# Patient Record
Sex: Female | Born: 1990 | Race: Black or African American | Hispanic: No | Marital: Single | Smoking: Current every day smoker
Health system: Southern US, Community
[De-identification: ages and names within clinical notes are randomized; demographics above are authoritative.]

## PROBLEM LIST (undated history)

## (undated) DIAGNOSIS — R87610 Atypical squamous cells of undetermined significance on cytologic smear of cervix (ASC-US): Secondary | ICD-10-CM

## (undated) DIAGNOSIS — Z8619 Personal history of other infectious and parasitic diseases: Secondary | ICD-10-CM

## (undated) DIAGNOSIS — N83209 Unspecified ovarian cyst, unspecified side: Secondary | ICD-10-CM

## (undated) DIAGNOSIS — N390 Urinary tract infection, site not specified: Secondary | ICD-10-CM

## (undated) DIAGNOSIS — N760 Acute vaginitis: Secondary | ICD-10-CM

## (undated) DIAGNOSIS — B9689 Other specified bacterial agents as the cause of diseases classified elsewhere: Secondary | ICD-10-CM

## (undated) DIAGNOSIS — R87810 Cervical high risk human papillomavirus (HPV) DNA test positive: Secondary | ICD-10-CM

## (undated) HISTORY — DX: Acute vaginitis: N76.0

## (undated) HISTORY — DX: Cervical high risk human papillomavirus (HPV) DNA test positive: R87.810

## (undated) HISTORY — DX: Other specified bacterial agents as the cause of diseases classified elsewhere: B96.89

## (undated) HISTORY — DX: Unspecified ovarian cyst, unspecified side: N83.209

## (undated) HISTORY — DX: Atypical squamous cells of undetermined significance on cytologic smear of cervix (ASC-US): R87.610

## (undated) HISTORY — DX: Personal history of other infectious and parasitic diseases: Z86.19

---
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
## 2010-05-06 ENCOUNTER — Emergency Department (HOSPITAL_COMMUNITY): Admission: EM | Admit: 2010-05-06 | Discharge: 2010-05-06 | Payer: Self-pay | Admitting: Family Medicine

## 2010-11-03 LAB — POCT URINALYSIS DIP (DEVICE)
Glucose, UA: NEGATIVE mg/dL
Ketones, ur: 15 mg/dL — AB
Protein, ur: 100 mg/dL — AB
Specific Gravity, Urine: 1.025 (ref 1.005–1.030)

## 2010-11-03 LAB — POCT PREGNANCY, URINE: Preg Test, Ur: NEGATIVE

## 2011-04-29 ENCOUNTER — Encounter (HOSPITAL_COMMUNITY): Payer: Self-pay

## 2011-04-29 ENCOUNTER — Inpatient Hospital Stay (HOSPITAL_COMMUNITY)
Admission: AD | Admit: 2011-04-29 | Discharge: 2011-04-29 | Disposition: A | Discharge status: Home / Self Care | Payer: Self-pay | Source: Ambulatory Visit | Attending: Obstetrics & Gynecology | Admitting: Obstetrics & Gynecology

## 2011-04-29 DIAGNOSIS — Z331 Pregnant state, incidental: Secondary | ICD-10-CM

## 2011-04-29 DIAGNOSIS — Z3201 Encounter for pregnancy test, result positive: Secondary | ICD-10-CM | POA: Insufficient documentation

## 2011-04-29 LAB — POCT PREGNANCY, URINE: Preg Test, Ur: POSITIVE

## 2011-04-29 NOTE — ED Provider Notes (Signed)
History     Chief Complaint  Patient presents with  . Possible Pregnancy   HPIBianca R Garner20 y.o. is here for pregnancy verification.  She is G1.  LMP was 03/20/11.  She is upset about the test results.  She had a positive test at home and it is positive here.  She denies pain and vaginal bleeding.      No past medical history on file.  No past surgical history on file.  No family history on file.  History  Substance Use Topics  . Smoking status: Not on file  . Smokeless tobacco: Not on file  . Alcohol Use: Not on file    Allergies: Allergies not on file  No prescriptions prior to admission    Review of Systems  Gastrointestinal: Positive for nausea, vomiting and abdominal pain.  Genitourinary: Negative.    Physical Exam   Blood pressure 120/81, pulse 88, temperature 98.1 F (36.7 C), temperature source Oral, resp. rate 16, height 5\' 1"  (1.549 m), weight 103 lb 6 oz (46.891 kg), last menstrual period 03/20/2011.  Physical Exam  Not indicated  MAU Course  Procedures  MDM   Assessment and Plan  A:  Positive pregnancy test  P  Verification letter given.  KEY,EVE M 04/29/2011, 3:29 PM   Matt Holmes, NP 04/29/11 1532

## 2011-04-29 NOTE — Progress Notes (Signed)
Pt states lmp 03/20/2011, home upt yesterday positive, denies pain, bleeding or vag d/c. Here for pregnancy verification letter.

## 2011-04-29 NOTE — ED Provider Notes (Signed)
Attestation of Attending Supervision of Advanced Practitioner: Evaluation and management procedures were performed by the PA/NP/CNM/OB Fellow under my supervision/collaboration. Chart reviewed and agree with management and plan.  Avion Patella A 04/29/2011 6:44 PM

## 2011-06-09 ENCOUNTER — Emergency Department: Payer: Self-pay | Admitting: Unknown Physician Specialty

## 2011-10-13 ENCOUNTER — Encounter (HOSPITAL_COMMUNITY): Payer: Self-pay | Admitting: Emergency Medicine

## 2011-10-13 ENCOUNTER — Emergency Department (HOSPITAL_COMMUNITY)
Admission: EM | Admit: 2011-10-13 | Discharge: 2011-10-13 | Disposition: A | Discharge status: Home / Self Care | Payer: Self-pay | Attending: Emergency Medicine | Admitting: Emergency Medicine

## 2011-10-13 DIAGNOSIS — F17200 Nicotine dependence, unspecified, uncomplicated: Secondary | ICD-10-CM | POA: Insufficient documentation

## 2011-10-13 DIAGNOSIS — N760 Acute vaginitis: Secondary | ICD-10-CM | POA: Insufficient documentation

## 2011-10-13 DIAGNOSIS — A499 Bacterial infection, unspecified: Secondary | ICD-10-CM | POA: Insufficient documentation

## 2011-10-13 DIAGNOSIS — M2550 Pain in unspecified joint: Secondary | ICD-10-CM | POA: Insufficient documentation

## 2011-10-13 DIAGNOSIS — N949 Unspecified condition associated with female genital organs and menstrual cycle: Secondary | ICD-10-CM | POA: Insufficient documentation

## 2011-10-13 DIAGNOSIS — M25559 Pain in unspecified hip: Secondary | ICD-10-CM

## 2011-10-13 DIAGNOSIS — B9689 Other specified bacterial agents as the cause of diseases classified elsewhere: Secondary | ICD-10-CM

## 2011-10-13 DIAGNOSIS — R109 Unspecified abdominal pain: Secondary | ICD-10-CM | POA: Insufficient documentation

## 2011-10-13 HISTORY — DX: Urinary tract infection, site not specified: N39.0

## 2011-10-13 LAB — WET PREP, GENITAL
Trich, Wet Prep: NONE SEEN
Yeast Wet Prep HPF POC: NONE SEEN

## 2011-10-13 LAB — URINALYSIS, ROUTINE W REFLEX MICROSCOPIC
Nitrite: NEGATIVE
Specific Gravity, Urine: 1.027 (ref 1.005–1.030)
Urobilinogen, UA: 1 mg/dL (ref 0.0–1.0)

## 2011-10-13 LAB — PREGNANCY, URINE: Preg Test, Ur: NEGATIVE

## 2011-10-13 MED ORDER — ACETAMINOPHEN-CODEINE #3 300-30 MG PO TABS: 1.0000 | ORAL_TABLET | Freq: Four times a day (QID) | ORAL | Status: AC | PRN

## 2011-10-13 MED ORDER — METRONIDAZOLE 500 MG PO TABS: 500.0000 mg | ORAL_TABLET | Freq: Two times a day (BID) | ORAL | Status: AC

## 2011-10-13 NOTE — Discharge Instructions (Signed)
Bacterial Vaginosis Bacterial vaginosis (BV) is a vaginal infection where the normal balance of bacteria in the vagina is disrupted. The normal balance is then replaced by an overgrowth of certain bacteria. There are several different kinds of bacteria that can cause BV. BV is the most common vaginal infection in women of childbearing age. CAUSES   The cause of BV is not fully understood. BV develops when there is an increase or imbalance of harmful bacteria.   Some activities or behaviors can upset the normal balance of bacteria in the vagina and put women at increased risk including:   Having a new sex partner or multiple sex partners.   Douching.   Using an intrauterine device (IUD) for contraception.   It is not clear what role sexual activity plays in the development of BV. However, women that have never had sexual intercourse are rarely infected with BV.  Women do not get BV from toilet seats, bedding, swimming pools or from touching objects around them.  SYMPTOMS   Grey vaginal discharge.   A fish-like odor with discharge, especially after sexual intercourse.   Itching or burning of the vagina and vulva.   Burning or pain with urination.   Some women have no signs or symptoms at all.  DIAGNOSIS  Your caregiver must examine the vagina for signs of BV. Your caregiver will perform lab tests and look at the sample of vaginal fluid through a microscope. They will look for bacteria and abnormal cells (clue cells), a pH test higher than 4.5, and a positive amine test all associated with BV.  RISKS AND COMPLICATIONS   Pelvic inflammatory disease (PID).   Infections following gynecology surgery.   Developing HIV.   Developing herpes virus.  TREATMENT  Sometimes BV will clear up without treatment. However, all women with symptoms of BV should be treated to avoid complications, especially if gynecology surgery is planned. Female partners generally do not need to be treated. However,  BV may spread between female sex partners so treatment is helpful in preventing a recurrence of BV.   BV may be treated with antibiotics. The antibiotics come in either pill or vaginal cream forms. Either can be used with nonpregnant or pregnant women, but the recommended dosages differ. These antibiotics are not harmful to the baby.   BV can recur after treatment. If this happens, a second round of antibiotics will often be prescribed.   Treatment is important for pregnant women. If not treated, BV can cause a premature delivery, especially for a pregnant woman who had a premature birth in the past. All pregnant women who have symptoms of BV should be checked and treated.   For chronic reoccurrence of BV, treatment with a type of prescribed gel vaginally twice a week is helpful.  HOME CARE INSTRUCTIONS   Finish all medication as directed by your caregiver.   Do not have sex until treatment is completed.   Tell your sexual partner that you have a vaginal infection. They should see their caregiver and be treated if they have problems, such as a mild rash or itching.   Practice safe sex. Use condoms. Only have 1 sex partner.  PREVENTION  Basic prevention steps can help reduce the risk of upsetting the natural balance of bacteria in the vagina and developing BV:  Do not have sexual intercourse (be abstinent).   Do not douche.   Use all of the medicine prescribed for treatment of BV, even if the signs and symptoms go away.     Tell your sex partner if you have BV. That way, they can be treated, if needed, to prevent reoccurrence.  SEEK MEDICAL CARE IF:   Your symptoms are not improving after 3 days of treatment.   You have increased discharge, pain, or fever.  MAKE SURE YOU:   Understand these instructions.   Will watch your condition.   Will get help right away if you are not doing well or get worse.  FOR MORE INFORMATION  Division of STD Prevention (DSTDP), Centers for Disease  Control and Prevention: SolutionApps.co.za American Social Health Association (ASHA): www.ashastd.org  Document Released: 08/02/2005 Document Revised: 04/14/2011 Document Reviewed: 01/23/2009 [facility] Patient Information 2012 [place], [place].     Hip Pain The hips join the upper legs to the lower pelvis. The bones, cartilage, tendons, and muscles of the hip joint perform a lot of work each day holding your body weight and allowing you to move around. Hip pain is a common symptom. It can range from a minor ache to severe pain on 1 or both hips. Pain may be felt on the inside of the hip joint near the groin, or the outside near the buttocks and upper thigh. There may be swelling or stiffness as well. It occurs more often when a person walks or performs activity. There are many reasons hip pain can develop. CAUSES  It is important to work with your caregiver to identify the cause since many conditions can impact the bones, cartilage, muscles, and tendons of the hips. Causes for hip pain include:  Broken (fractured) bones.   Separation of the thighbone from the hip socket (dislocation).   Torn cartilage of the hip joint.   Swelling (inflammation) of a tendon (tendonitis), the sac within the hip joint (bursitis), or a joint.   A weakening in the abdominal wall (hernia), affecting the nerves to the hip.   Arthritis in the hip joint or lining of the hip joint.   Pinched nerves in the back, hip, or upper thigh.   A bulging disc in the spine (herniated disc).   Rarely, bone infection or cancer.  DIAGNOSIS  The location of your hip pain will help your caregiver understand what may be causing the pain. A diagnosis is based on your medical history, your symptoms, results from your physical exam, and results from diagnostic tests. Diagnostic tests may include X-ray exams, a computerized magnetic scan (magnetic resonance imaging, MRI), or bone scan. TREATMENT  Treatment will depend on the cause of  your hip pain. Treatment may include:  Limiting activities and resting until symptoms improve.   Crutches or other walking supports (a cane or brace).   Ice, elevation, and compression.   Physical therapy or home exercises.   Shoe inserts or special shoes.   Losing weight.   Medications to reduce pain.   Undergoing surgery.  HOME CARE INSTRUCTIONS   Only take over-the-counter or prescription medicines for pain, discomfort, or fever as directed by your caregiver.   Put ice on the injured area:   Put ice in a plastic bag.   Place a towel between your skin and the bag.   Leave the ice on for 15 to 20 minutes at a time, 3 to 4 times a day.   Keep your leg raised (elevated) when possible to lessen swelling.   Avoid activities that cause pain.   Follow specific exercises as directed by your caregiver.   Sleep with a pillow between your legs on your most comfortable side.  Record how often you have hip pain, the location of the pain, and what it feels like. This information may be helpful to you and your caregiver.   Ask your caregiver about returning to work or sports and whether you should drive.   Follow up with your caregiver for further exams, therapy, or testing as directed.  SEEK MEDICAL CARE IF:   Your pain or swelling continues or worsens after 1 week.   You are feeling unwell or have chills.   You have increasing difficulty with walking.   You have a loss of sensation or other new symptoms.   You have questions or concerns.  SEEK IMMEDIATE MEDICAL CARE IF:   You cannot put weight on the affected hip.   You have fallen.   You have a sudden increase in pain and swelling in your hip.   You have a fever.  MAKE SURE YOU:   Understand these instructions.   Will watch your condition.   Will get help right away if you are not doing well or get worse.  Document Released: 01/20/2010 Document Revised: 04/14/2011 Document Reviewed: 01/20/2010 [facility]  Patient Information 2012 [place], [place].      Narcotic and benzodiazepine use may cause drowsiness, slowed breathing or dependence.  Please use with caution and do not drive, operate machinery or watch young children alone while taking them.  Taking combinations of these medications or drinking alcohol will potentiate these effects.   Do not drink alcohol while taking metronidazole antibiotic.  In addition, when taking certain antibiotics, birth control pills may not be as effective.

## 2011-10-13 NOTE — ED Notes (Signed)
Pt c/o right hip/flank pain x 2 months. Pt states pain became slightly worse today, pain worse with movement and palpation.  Pt also c/o pelvic pain and nausea.

## 2011-10-13 NOTE — ED Provider Notes (Signed)
History     CSN: [id number]  Arrival date & time 10/13/11  1246   First MD Initiated Contact with Patient 10/13/11 1512      Chief Complaint  Patient presents with  . Flank Pain  . Pelvic Pain    (Consider location/radiation/quality/duration/timing/severity/associated sxs/prior treatment) HPI Comments: Pt reports 2 separate issues I think.  Pt reports she has intermittent sharp pains in right posterior hip region for past 2-3 months.  Hurts sharply and briefly with certain movements of hip such as stepping up, bending forward.  Pain will then subside.  This AM, she had pain in similar location but around to right lower groin area and had some crampiness associated with it as well.  No dysuria, no abd pain, no N/V/D, constipation.  No change with eating.  No bloating.  LMP was about 16 days ago and was normal.  + sexually active, uses condoms.  Spoke to PCP recently about OCP's but is not using them.  She was fearful today about blood clots, and also inquires about scoliosis.  She denies lower extremity swelling, calf tenderness, has not been flying or prolonged immobilization.  No CP, pleurisy, cough,. SOB.  She denies any unusual vaginal discharge.  She denies fever or chills.    Patient is a 21 y.o. female presenting with flank pain and pelvic pain. The history is provided by the patient.  Flank Pain Pertinent negatives include no chest pain, no abdominal pain and no shortness of breath.  Pelvic Pain Pertinent negatives include no chest pain, no abdominal pain and no shortness of breath.    Past Medical History  Diagnosis Date  . Urinary tract infection     History reviewed. No pertinent past surgical history.  History reviewed. No pertinent family history.  History  Substance Use Topics  . Smoking status: Current Everyday Smoker  . Smokeless tobacco: Not on file  . Alcohol Use: Yes     weekends    OB History    Grav Para Term Preterm Abortions TAB SAB Ect Mult Living   1               Review of Systems  Constitutional: Negative.   Respiratory: Negative for shortness of breath.   Cardiovascular: Negative for chest pain.  Gastrointestinal: Negative for nausea, vomiting, abdominal pain, diarrhea, constipation and blood in stool.  Genitourinary: Positive for flank pain and pelvic pain. Negative for dysuria, vaginal bleeding, vaginal discharge and vaginal pain.  Musculoskeletal: Positive for arthralgias. Negative for back pain.  Skin: Negative for rash.    Allergies  Review of patient's allergies indicates no known allergies.  Home Medications   Current Outpatient Rx  Name Route Sig Dispense Refill  . ACETAMINOPHEN 325 MG PO TABS Oral Take 650 mg by mouth every 6 (six) hours as needed. For pain    . IBUPROFEN 200 MG PO TABS Oral Take 200 mg by mouth every 6 (six) hours as needed. For pain    . ACETAMINOPHEN-CODEINE #3 300-30 MG PO TABS Oral Take 1-2 tablets by mouth every 6 (six) hours as needed for pain. 20 tablet 0  . METRONIDAZOLE 500 MG PO TABS Oral Take 1 tablet (500 mg total) by mouth 2 (two) times daily. 14 tablet 0    BP 110/62  Pulse 81  Temp(Src) 98.5 F (36.9 C) (Oral)  Resp 16  SpO2 99%  LMP 09/29/2011  Breastfeeding? Unknown  Physical Exam  Nursing note and vitals reviewed. Constitutional: She appears well-developed and well-nourished.  Cardiovascular: Normal rate and regular rhythm.   Pulmonary/Chest: Effort normal.  Abdominal: Soft. Bowel sounds are normal. She exhibits no distension and no mass. There is no tenderness. There is no rebound and no guarding. Hernia confirmed negative in the right inguinal area.  Genitourinary: Vagina normal and uterus normal. Pelvic exam was performed with patient supine. There is no tenderness or lesion on the right labia. There is no tenderness or lesion on the left labia. Cervix exhibits no motion tenderness, no discharge and no friability. Right adnexum displays tenderness. Right adnexum  displays no mass and no fullness. Left adnexum displays no mass, no tenderness and no fullness.       Creamy white/yellow discharge as well as some mild curd like discharge at posterior fornix.  Pt did being using monistat yesterday  Skin: Skin is warm and dry. No erythema.    ED Course  Procedures (including critical care time)  Labs Reviewed  URINALYSIS, ROUTINE W REFLEX MICROSCOPIC - Abnormal; Notable for the following:    APPearance CLOUDY (*)    Ketones, ur 15 (*)    All other components within normal limits  WET PREP, GENITAL - Abnormal; Notable for the following:    Clue Cells Wet Prep HPF POC FEW (*)    WBC, Wet Prep HPF POC FEW (*)    All other components within normal limits  PREGNANCY, URINE  GC/CHLAMYDIA PROBE AMP, GENITAL   No results found.   1. Hip pain   2. BV (bacterial vaginosis)       MDM  UA and U preg were neg.  Pt's exam suggest slight minimal adnexal tenderness.  Given 2 weeks after her menstural cycle, pt may have ovulated or has a small right ovarian cyst.  I am not clincally concerned for torsion.  Cultures and wet prep sent and she knows she will be called if cultures are positive.      5:18 PM Wet prep shows few clue cells suggesting mild BV.  Otherwise I feel pt has mild adnexal discomfort and probably some early right hip arthritic type pains or arthralgias that I recommended NSAIDs for.      Gavin Pound. Jordi Lacko, MD 10/13/11 1719

## 2011-10-13 NOTE — ED Notes (Signed)
MD at bedside. 

## 2011-10-14 LAB — GC/CHLAMYDIA PROBE AMP, GENITAL
Chlamydia, DNA Probe: NEGATIVE
GC Probe Amp, Genital: NEGATIVE

## 2012-09-08 ENCOUNTER — Emergency Department: Payer: Self-pay | Admitting: Emergency Medicine

## 2012-09-08 LAB — CBC
HCT: 33.4 % — ABNORMAL LOW (ref 35.0–47.0)
HGB: 11.3 g/dL — ABNORMAL LOW (ref 12.0–16.0)
MCH: 31 pg (ref 26.0–34.0)
MCV: 92 fL (ref 80–100)
RBC: 3.63 10*6/uL — ABNORMAL LOW (ref 3.80–5.20)

## 2012-09-08 LAB — URINALYSIS, COMPLETE
Blood: NEGATIVE
Protein: NEGATIVE
RBC,UR: 2 /HPF (ref 0–5)

## 2012-09-08 LAB — BASIC METABOLIC PANEL
Anion Gap: 8 (ref 7–16)
BUN: 8 mg/dL (ref 7–18)
Calcium, Total: 8.7 mg/dL (ref 8.5–10.1)
Creatinine: 0.49 mg/dL — ABNORMAL LOW (ref 0.60–1.30)
EGFR (African American): >60
Glucose: 90 mg/dL (ref 65–99)
Potassium: 3.4 mmol/L — ABNORMAL LOW (ref 3.5–5.1)

## 2012-09-08 LAB — HCG, QUANTITATIVE, PREGNANCY: Beta Hcg, Quant.: 7310 m[IU]/mL — ABNORMAL HIGH

## 2012-09-11 LAB — URINE CULTURE

## 2012-10-05 DIAGNOSIS — R87610 Atypical squamous cells of undetermined significance on cytologic smear of cervix (ASC-US): Secondary | ICD-10-CM

## 2012-10-05 DIAGNOSIS — Z8619 Personal history of other infectious and parasitic diseases: Secondary | ICD-10-CM

## 2012-10-05 HISTORY — DX: Atypical squamous cells of undetermined significance on cytologic smear of cervix (ASC-US): R87.610

## 2012-10-05 HISTORY — DX: Personal history of other infectious and parasitic diseases: Z86.19

## 2012-10-05 LAB — HM PAP SMEAR

## 2013-04-09 ENCOUNTER — Observation Stay: Payer: Self-pay | Admitting: Obstetrics and Gynecology

## 2013-05-04 ENCOUNTER — Inpatient Hospital Stay: Payer: Self-pay | Admitting: Obstetrics and Gynecology

## 2013-05-04 LAB — CBC WITH DIFFERENTIAL/PLATELET
Basophil %: 0.3 %
Eosinophil #: 0 10*3/uL (ref 0.0–0.7)
Eosinophil %: 0.3 %
HCT: 38.5 % (ref 35.0–47.0)
HGB: 13.5 g/dL (ref 12.0–16.0)
Lymphocyte #: 1.3 10*3/uL (ref 1.0–3.6)
Lymphocyte %: 13.9 %
MCV: 92 fL (ref 80–100)
Monocyte %: 8.4 %
Neutrophil %: 77.1 %

## 2014-03-14 IMAGING — US US OB < 14 WEEKS - US OB TV
1 series · 13 of 28 positions shown · non-contrast
Comparison: none

REASON FOR EXAM: pelvic pain
COMMENTS:   May transport without cardiac monitor

[Series 1: us ob < 14 weeks - us ob tv · 0.23mm/px · 13 of 70 slices shown]
[im 3/70]
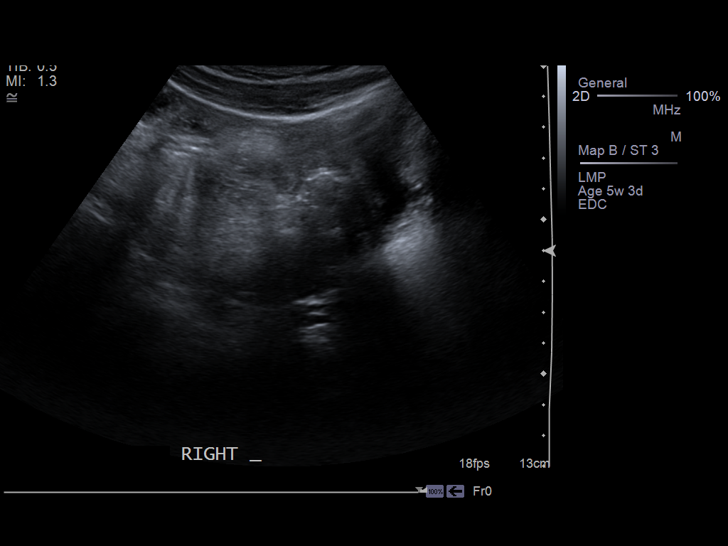
[im 8/70]
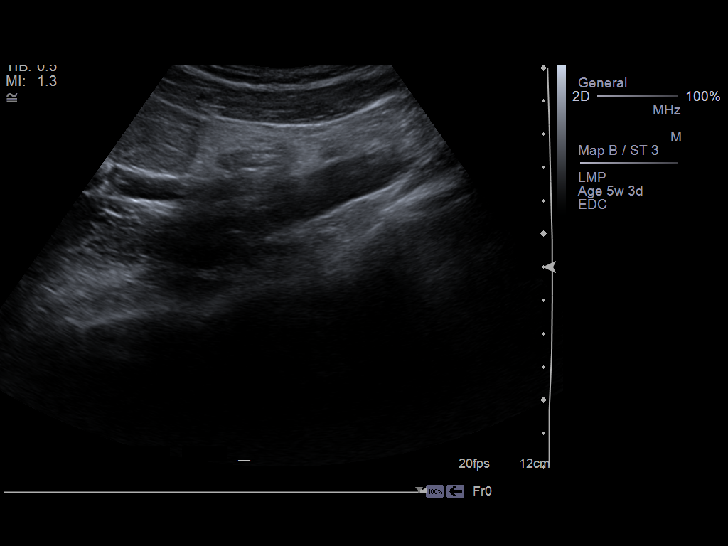
[im 13/70]
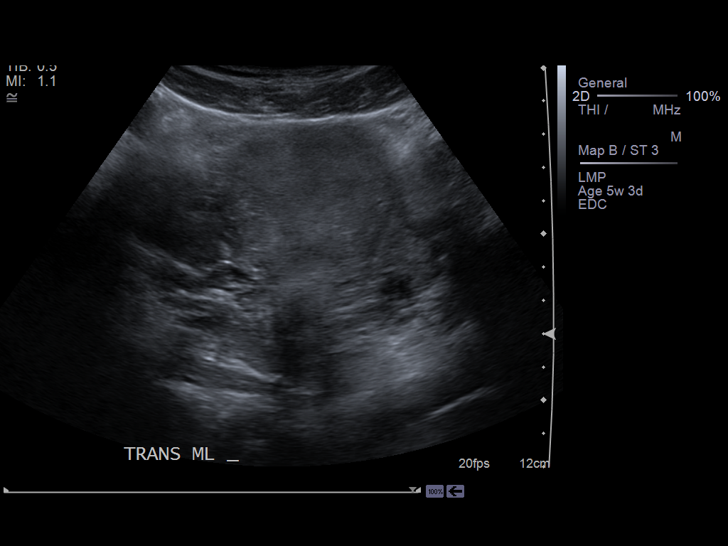
[im 18/70]
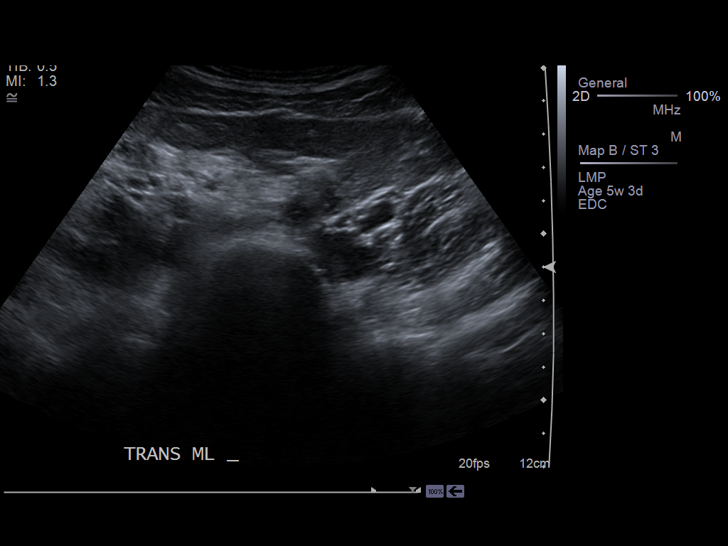
[im 24/70]
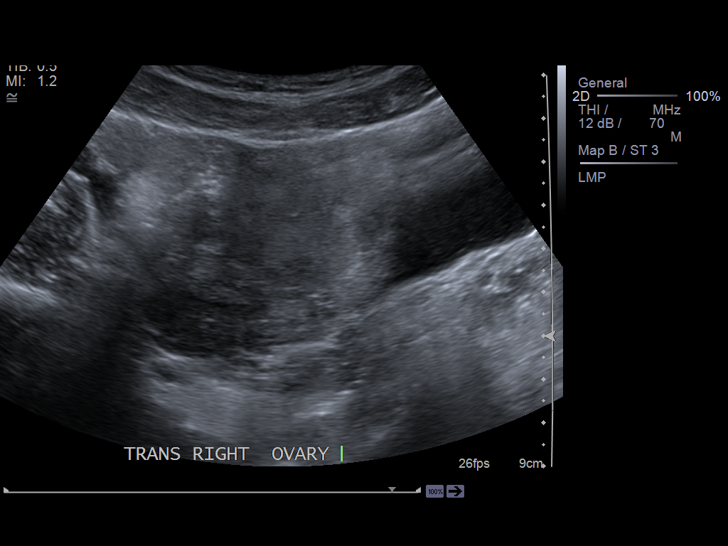
[im 29/70]
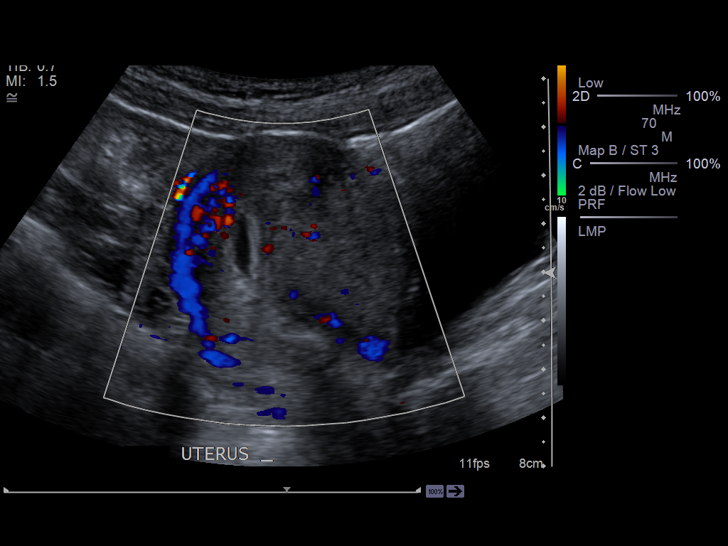
[im 36/70]
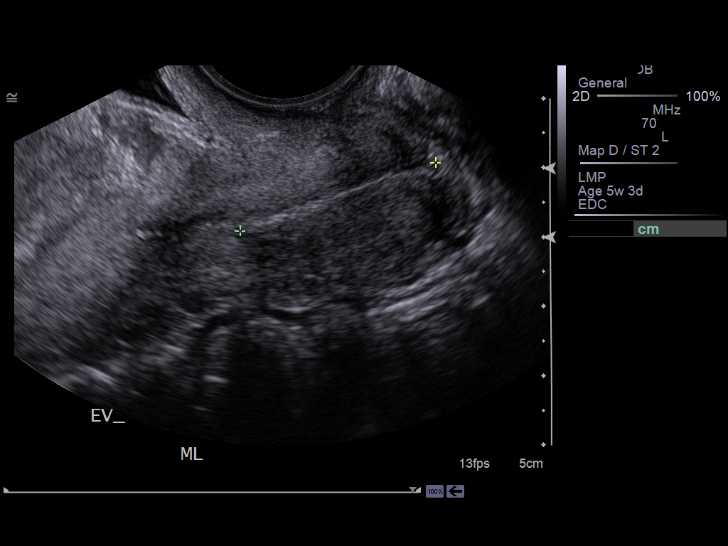
[im 41/70]
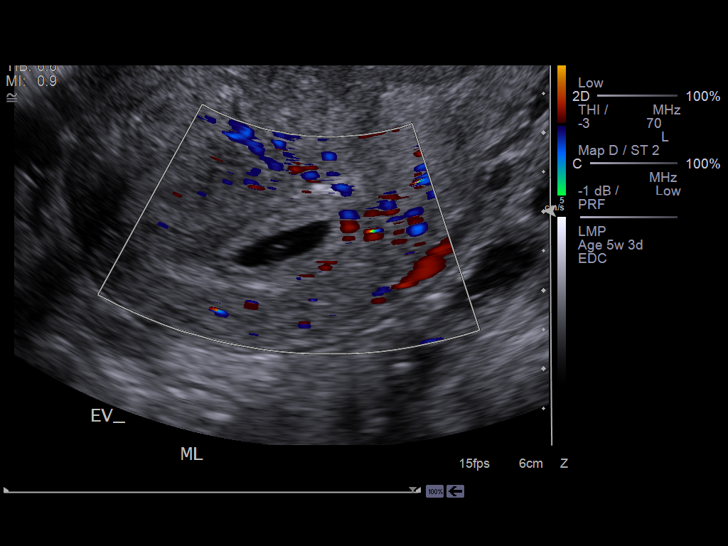
[im 47/70]
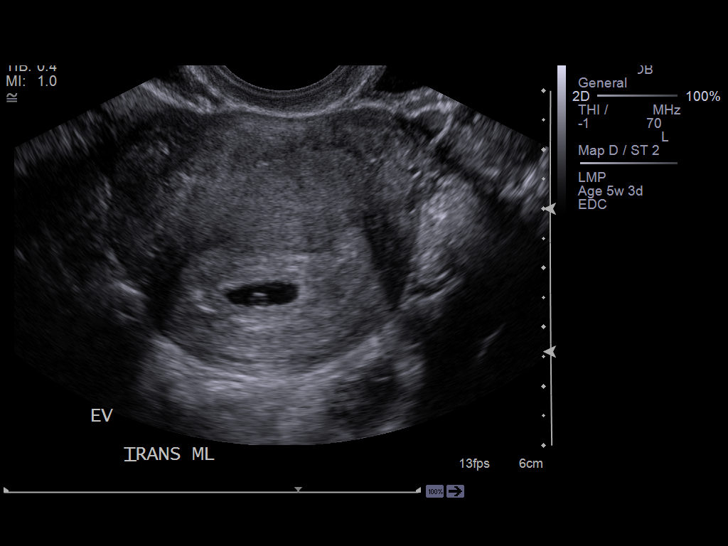
[im 52/70]
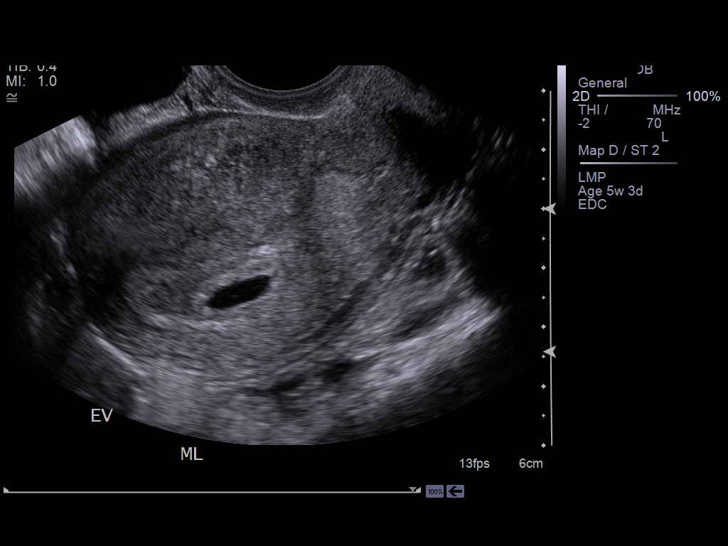
[im 57/70]
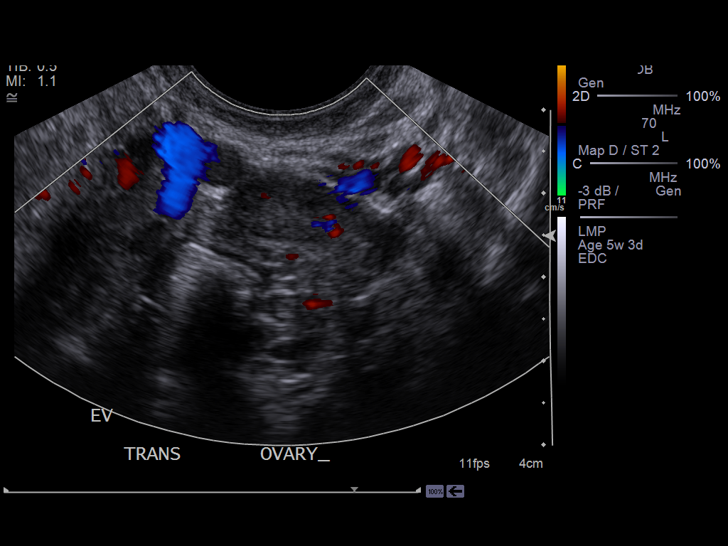
[im 62/70]
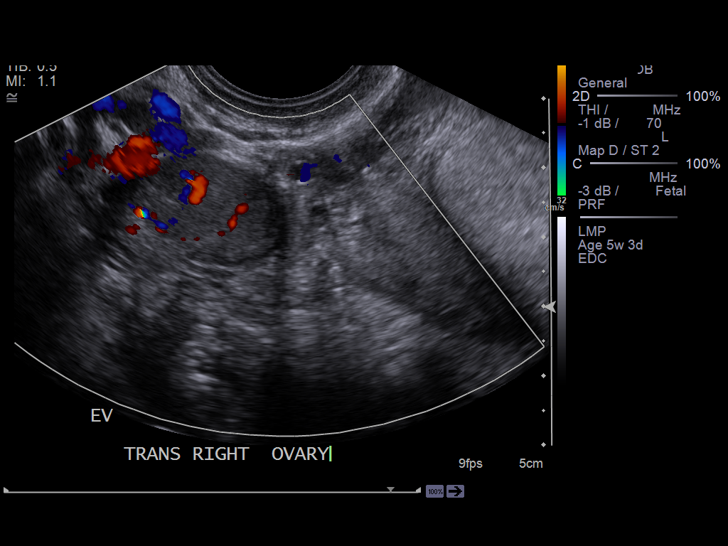
[im 67/70]
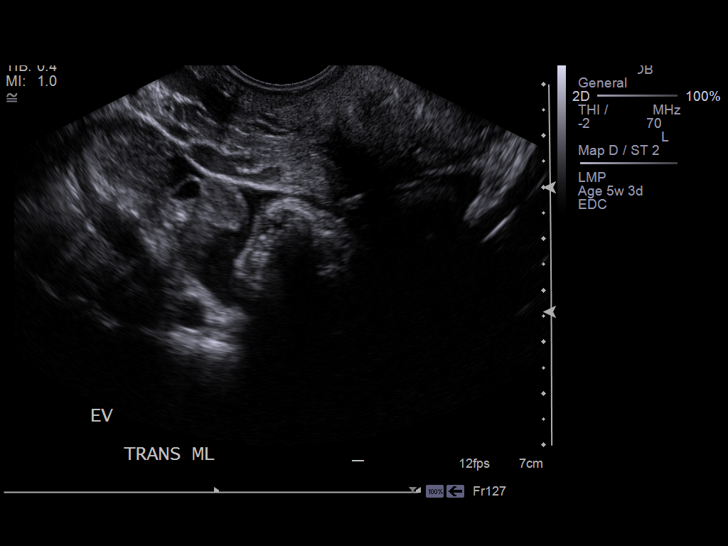

[13 of 28 positions shown; findings below may reference images not displayed]

PROCEDURE:     US  - US OB LESS THAN 14 WEEKS/W TRANS  - September 08, 2012  [DATE]

RESULT:     Emergent pelvic sonogram is performed with transabdominal and
endovaginal imaging. The kidneys show no obstructive change. The right ovary
measures 2.62 x 1.54 x 3.07 cm. The left ovary measures 3.95 x 1.88 x
cm. There is an intrauterine gestation with a yolk sac of 0.32 cm and a
gestational sac diameter of up to 1.32 cm with a mean measurement of 0.96 cm
which is consistent with a 5 week 5 day gestation + / - 12 days. A fetal pole
was not evident. Ultrasound EDC is 06 May, 2013. The uterus measures
8.56 x 5.38 x 5.01 cm. There is a prominent cyst in the left ovary measuring
1.95 x 2.66 x 2.03 cm. Blood flow is documented to both ovaries. There is a
small amount of free fluid seen. This is in the retrouterine cul-de-sac
midline region.
IMPRESSION: 1. Findings suggestive of an early intrauterine gestation, however, a fetal
pole was not identified. Correlate with serial quantitative beta-hCG
examinations and followup ultrasound as necessary. There is a moderately
large left ovarian cyst. There is some free fluid present.

[REDACTED]

## 2014-06-17 ENCOUNTER — Encounter (HOSPITAL_COMMUNITY): Payer: Self-pay | Admitting: Emergency Medicine

## 2014-12-24 NOTE — H&P (Signed)
L&D Evaluation:  History:  HPI 24 yo G3P0020 with EDC of 05/09/13 presenting at 35w gestation with complaint of persistant headache, RUQ pain, swelling, and numbness in her fingers.  She has been normotensive throughout this pregnancy, actually had a 24-hr urine baseline collected by one of the nurse practitioners which was normal (concentrated urine with 2+ protien and 2+ glucose).   Presents with headache   Patient's Medical History No Chronic Illness   Patient's Surgical History none   Medications Pre Natal Vitamins   Allergies NKDA   Social History none   Family History Non-Contributory   ROS:  ROS All systems were reviewed.  HEENT, CNS, GI, GU, Respiratory, CV, Renal and Musculoskeletal systems were found to be normal.   Exam:  Vital Signs stable   Urine Protein negative dipstick   General no apparent distress   Mental Status clear   Abdomen gravid, non-tender   Estimated Fetal Weight Average for gestational age   Back no CVAT   Edema no edema   FHT normal rate with no decels   Ucx absent   Impression:  Impression Headache   Plan:  Comments Reassured no evidence of PreEclampsia although some of the symptoms she reports are seen in preeclampsia she lacks the two hallmarks of elevated BP and proteinuria.  Patient reassured rx compazine for headahce   Electronic Signatures: Lorrene ReidStaebler, Apryll Hinkle M (MD)  (Signed 30-Aug-14 13:36)  Authored: L&D Evaluation   Last Updated: 30-Aug-14 13:36 by Lorrene ReidStaebler, Tivis Wherry M (MD)

## 2014-12-24 NOTE — H&P (Signed)
L&D Evaluation:  History Expanded:  HPI 24 yo G3P0020 with EDC of 05/09/13 presenting at 2339 2/7w gestation with complaint of contractions since MN, no vaginal bleeding or LOF. Good FM.   [facility].  No complications this pregnancy.  +HSV, on Valtrex.  No lesions. GBBS+.   Gravida 3   Term 0   PreTerm 0   Abortion 2   Living 0   EDC 09-May-2013   Presents with contractions   Patient's Medical History No Chronic Illness   Patient's Surgical History none   Medications Pre Natal Vitamins   Allergies NKDA   Social History none   Family History Non-Contributory   ROS:  ROS All systems were reviewed.  HEENT, CNS, GI, GU, Respiratory, CV, Renal and Musculoskeletal systems were found to be normal.   Exam:  Vital Signs stable   Urine Protein negative dipstick   General no apparent distress   Mental Status clear   Chest clear   Heart normal sinus rhythm   Abdomen gravid, tender with contractions   Estimated Fetal Weight Average for gestational age   Back no CVAT   Edema no edema   Pelvic no external lesions, 4/100/BBOW   Mebranes Intact   FHT normal rate with no decels   Ucx regular   Skin dry   Impression:  Impression active labor   Plan:  Plan EFM/NST, monitor contractions and for cervical change, antibiotics for GBBS prophylaxis   Comments May want epidural Consider AROM after ABX   Electronic Signatures: Samantha Duffy, Samantha Weismann Paul (MD)  (Signed 19-Sep-14 10:57)  Authored: L&D Evaluation   Last Updated: 19-Sep-14 10:57 by Samantha Duffy, Samantha Hinch Paul (MD)

## 2015-11-17 ENCOUNTER — Encounter: Payer: Self-pay | Admitting: *Deleted

## 2015-11-17 ENCOUNTER — Ambulatory Visit
Admission: EM | Admit: 2015-11-17 | Discharge: 2015-11-17 | Disposition: A | Discharge status: Home / Self Care | Payer: Managed Care, Other (non HMO) | Attending: Family Medicine | Admitting: Family Medicine

## 2015-11-17 DIAGNOSIS — F32A Depression, unspecified: Secondary | ICD-10-CM

## 2015-11-17 DIAGNOSIS — F329 Major depressive disorder, single episode, unspecified: Secondary | ICD-10-CM

## 2015-11-17 DIAGNOSIS — R5383 Other fatigue: Secondary | ICD-10-CM | POA: Diagnosis not present

## 2015-11-17 DIAGNOSIS — N921 Excessive and frequent menstruation with irregular cycle: Secondary | ICD-10-CM

## 2015-11-17 LAB — URINALYSIS COMPLETE WITH MICROSCOPIC (ARMC ONLY)
Bilirubin Urine: NEGATIVE
Glucose, UA: NEGATIVE mg/dL
Ketones, ur: NEGATIVE mg/dL
Nitrite: NEGATIVE
Specific Gravity, Urine: 1.025 (ref 1.005–1.030)
pH: 6.5 (ref 5.0–8.0)

## 2015-11-17 LAB — CBC WITH DIFFERENTIAL/PLATELET
Basophils Absolute: 0 10*3/uL (ref 0–0.1)
Basophils Relative: 0 %
Eosinophils Absolute: 0.3 10*3/uL (ref 0–0.7)
Eosinophils Relative: 4 %
HCT: 38.3 % (ref 35.0–47.0)
Hemoglobin: 13 g/dL (ref 12.0–16.0)
Lymphocytes Relative: 37 %
Lymphs Abs: 2.9 10*3/uL (ref 1.0–3.6)
MCH: 30.6 pg (ref 26.0–34.0)
MCHC: 33.8 g/dL (ref 32.0–36.0)
MCV: 90.5 fL (ref 80.0–100.0)
Monocytes Absolute: 0.7 10*3/uL (ref 0.2–0.9)
Monocytes Relative: 9 %
Neutro Abs: 3.9 10*3/uL (ref 1.4–6.5)
Neutrophils Relative %: 50 %
Platelets: 324 10*3/uL (ref 150–440)
RBC: 4.24 MIL/uL (ref 3.80–5.20)
RDW: 14.6 % — ABNORMAL HIGH (ref 11.5–14.5)
WBC: 7.8 10*3/uL (ref 3.6–11.0)

## 2015-11-17 LAB — COMPREHENSIVE METABOLIC PANEL
ALT: 10 U/L — ABNORMAL LOW (ref 14–54)
AST: 17 U/L (ref 15–41)
Albumin: 4.2 g/dL (ref 3.5–5.0)
Alkaline Phosphatase: 61 U/L (ref 38–126)
Anion gap: 4 — ABNORMAL LOW (ref 5–15)
BUN: 14 mg/dL (ref 6–20)
CO2: 26 mmol/L (ref 22–32)
Calcium: 9.4 mg/dL (ref 8.9–10.3)
Chloride: 108 mmol/L (ref 101–111)
Creatinine, Ser: 0.74 mg/dL (ref 0.44–1.00)
GFR calc Af Amer: >60 mL/min (ref >60)
GFR calc non Af Amer: >60 mL/min (ref >60)
Glucose, Bld: 84 mg/dL (ref 65–99)
Potassium: 3.9 mmol/L (ref 3.5–5.1)
Sodium: 138 mmol/L (ref 135–145)
Total Bilirubin: 0.4 mg/dL (ref 0.3–1.2)
Total Protein: 8.7 g/dL — ABNORMAL HIGH (ref 6.5–8.1)

## 2015-11-17 LAB — PREGNANCY, URINE: Preg Test, Ur: NEGATIVE

## 2015-11-17 NOTE — ED Provider Notes (Signed)
CSN: [id number]     Arrival date & time 11/17/15  1408 History   First MD Initiated Contact with Patient 11/17/15 1537     Chief Complaint  Patient presents with  . Fatigue  . Weakness  . Pelvic Pain   (Consider location/radiation/quality/duration/timing/severity/associated sxs/prior Treatment) HPI  So 25 year old female who presents with vague symptoms of weakness fatigue and pelvic pain with a history of low iron stores diagnosed at [facility]. States that she continues to not feel well is complaining of backaches and being tired all the time. In addition she has exertional dyspnea. States that she's been having periods despite the use of Provera injections. She was placed  on birth control pills  to try and regulate her periods. Despite this she has been having cramping and still bleeding  They put her on iron pills 3 times a day but she is only able to take it twice a day. She states that they have referred her to GYN. She has not been seen and is waiting for the phone call. This Morning she became alarmed because she was noticing more fatigue and shortness of breath with taking her shower.       Past Medical History  Diagnosis Date  . Urinary tract infection    History reviewed. No pertinent past surgical history. Family History  Problem Relation Age of Onset  . Hypertension Mother    Social History  Substance Use Topics  . Smoking status: Current Every Day Smoker  . Smokeless tobacco: None  . Alcohol Use: Yes     Comment: weekends   OB History    Gravida Para Term Preterm AB TAB SAB Ectopic Multiple Living   1              Review of Systems  Constitutional: Positive for activity change and fatigue. Negative for fever and chills.  Respiratory: Positive for shortness of breath.   Neurological: Positive for weakness.  All other systems reviewed and are negative.   Allergies  Review of patient's allergies indicates no known allergies.  Home  Medications   Prior to Admission medications   Medication Sig Start Date End Date Taking? Authorizing Provider  acetaminophen (TYLENOL) 325 MG tablet Take 650 mg by mouth every 6 (six) hours as needed. For pain   Yes Historical Provider, MD  ibuprofen (ADVIL,MOTRIN) 200 MG tablet Take 200 mg by mouth every 6 (six) hours as needed. For pain   Yes Historical Provider, MD   Meds Ordered and Administered this Visit  Medications - No data to display  BP 114/77 mmHg  Pulse 82  Temp(Src) 98.1 F (36.7 C) (Oral)  Resp 16  Ht 5\' 1"  (1.549 m)  Wt 110 lb (49.896 kg)  BMI 20.80 kg/m2  SpO2 100%  LMP 11/17/2015 No data found.   Physical Exam  Constitutional: She is oriented to person, place, and time. She appears well-developed and well-nourished. No distress.  HENT:  Head: Normocephalic and atraumatic.  Right Ear: External ear normal.  Left Ear: External ear normal.  Nose: Nose normal.  Mouth/Throat: Oropharynx is clear and moist.  Eyes: Conjunctivae are normal. Pupils are equal, round, and reactive to light.  Neck: Normal range of motion. Neck supple.  Pulmonary/Chest: Effort normal and breath sounds normal. No respiratory distress. She has no wheezes. She has no rales.  Abdominal: Soft. Bowel sounds are normal. She exhibits no distension. There is no tenderness. There is no rebound and no guarding.  Musculoskeletal: Normal  range of motion. She exhibits no edema or tenderness.  Neurological: She is alert and oriented to person, place, and time.  Skin: Skin is warm and dry. She is not diaphoretic.  Psychiatric: She has a normal mood and affect. Her behavior is normal. Judgment and thought content normal.  Nursing note and vitals reviewed.   ED Course  Procedures (including critical care time)  Labs Review Labs Reviewed  CBC WITH DIFFERENTIAL/PLATELET - Abnormal; Notable for the following:    RDW 14.6 (*)    All other components within normal limits  COMPREHENSIVE METABOLIC PANEL  - Abnormal; Notable for the following:    Total Protein 8.7 (*)    ALT 10 (*)    Anion gap 4 (*)    All other components within normal limits  URINALYSIS COMPLETEWITH MICROSCOPIC ([facility] ONLY) - Abnormal; Notable for the following:    APPearance HAZY (*)    Hgb urine dipstick 3+ (*)    Protein, ur TRACE (*)    Leukocytes, UA TRACE (*)    Bacteria, UA FEW (*)    Squamous Epithelial / LPF 0-5 (*)    All other components within normal limits  URINE CULTURE  PREGNANCY, URINE  IRON AND TIBC  FERRITIN    Imaging Review No results found.   Visual Acuity Review  Right Eye Distance:   Left Eye Distance:   Bilateral Distance:    Right Eye Near:   Left Eye Near:    Bilateral Near:         MDM   1. Fatigue due to depression   2. Menorrhagia with irregular cycle    Discharge Medication List as of 11/17/2015  5:27 PM    Plan: 1. Test/x-ray results and diagnosis reviewed with patient 2. rx as per orders; risks, benefits, potential side effects reviewed with patient 3. Recommend supportive treatment with Continue taking her iron pills and asked her to add vitamin C at the time she takes it for better uptake of the iron. I asked her if she was depressed and she stated she probably was but did not wish to speak to me at this time. Recommended that she find help through [facility] for referral. She did make that she is not suicidal or homicidal. We will have results of the iron studies tomorrow she'll call for those results. Also asked her to contact [facility] to see if they can expedite her GYN appointment. 4. F/u prn if symptoms worsen or don't improve     Lutricia Feil, PA-C 11/17/15 1735

## 2015-11-17 NOTE — ED Notes (Signed)
C/o general weakness, fatigue, and "pelvic" pain. Hx of low iron.

## 2015-11-17 NOTE — Discharge Instructions (Signed)
Abnormal Uterine Bleeding °Abnormal uterine bleeding can affect women at various stages in life, including teenagers, women in their reproductive years, pregnant women, and women who have reached menopause. Several kinds of uterine bleeding are considered abnormal, including: °· Bleeding or spotting between periods.   °· Bleeding after sexual intercourse.   °· Bleeding that is heavier or more than normal.   °· Periods that last longer than usual. °· Bleeding after menopause.   °Many cases of abnormal uterine bleeding are minor and simple to treat, while others are more serious. Any type of abnormal bleeding should be evaluated by your health care provider. Treatment will depend on the cause of the bleeding. °HOME CARE INSTRUCTIONS °Monitor your condition for any changes. The following actions may help to alleviate any discomfort you are experiencing: °· Avoid the use of tampons and douches as directed by your health care provider. °· Change your pads frequently. °You should get regular pelvic exams and Pap tests. Keep all follow-up appointments for diagnostic tests as directed by your health care provider.  °SEEK MEDICAL CARE IF:  °· Your bleeding lasts more than 1 week.   °· You feel dizzy at times.   °SEEK IMMEDIATE MEDICAL CARE IF:  °· You pass out.   °· You are changing pads every 15 to 30 minutes.   °· You have abdominal pain. °· You have a fever.   °· You become sweaty or weak.   °· You are passing large blood clots from the vagina.   °· You start to feel nauseous and vomit. °MAKE SURE YOU:  °· Understand these instructions. °· Will watch your condition. °· Will get help right away if you are not doing well or get worse. °  °This information is not intended to replace advice given to you by your health care provider. Make sure you discuss any questions you have with your health care provider. °  °Document Released: 08/02/2005 Document Revised: 08/07/2013 Document Reviewed: 03/01/2013 °Elsevier Interactive  Patient Education ©2016 [employer] ° °Menorrhagia °Menorrhagia is the medical term for when your menstrual periods are heavy or last longer than usual. With menorrhagia, every period you have may cause enough blood loss and cramping that you are unable to maintain your usual activities. °CAUSES  °In some cases, the cause of heavy periods is unknown, but a number of conditions may cause menorrhagia. Common causes include: °· A problem with the hormone-producing thyroid gland (hypothyroid). °· Noncancerous growths in the uterus (polyps or fibroids). °· An imbalance of the estrogen and progesterone hormones. °· One of your ovaries not releasing an egg during one or more months. °· Side effects of having an intrauterine device (IUD). °· Side effects of some medicines, such as anti-inflammatory medicines or blood thinners. °· A bleeding disorder that stops your blood from clotting normally. °SIGNS AND SYMPTOMS  °During a normal period, bleeding lasts between 4 and 8 days. Signs that your periods are too heavy include: °· You routinely have to change your pad or tampon every 1 or 2 hours because it is completely soaked. °· You pass blood clots larger than 1 inch (2.5 cm) in size. °· You have bleeding for more than 7 days. °· You need to use pads and tampons at the same time because of heavy bleeding. °· You need to wake up to change your pads or tampons during the night. °· You have symptoms of anemia, such as tiredness, fatigue, or shortness of breath.  °DIAGNOSIS  °Your health care provider will perform a physical exam and ask you questions about your   symptoms and menstrual history. Other tests may be ordered based on what the health care provider finds during the exam. These tests can include: °· Blood tests. Blood tests are used to check if you are pregnant or have hormonal changes, a bleeding or thyroid disorder, low iron levels (anemia), or other problems. °· Endometrial biopsy. Your health care provider takes a  sample of tissue from the inside of your uterus to be examined under a microscope. °· Pelvic ultrasound. This test uses sound waves to make a picture of your uterus, ovaries, and vagina. The pictures can show if you have fibroids or other growths. °· Hysteroscopy. For this test, your health care provider will use a small telescope to look inside your uterus. °Based on the results of your initial tests, your health care provider may recommend further testing. °TREATMENT  °Treatment may not be needed. If it is needed, your health care provider may recommend treatment with one or more medicines first. If these do not reduce bleeding enough, a surgical treatment might be an option. The best treatment for you will depend on:  °· Whether you need to prevent pregnancy.   °· Your desire to have children in the future. °· The cause and severity of your bleeding. °· Your opinion and personal preference.   °Medicines for menorrhagia may include: °· Birth control methods that use hormones. These include the pill, skin patch, vaginal ring, shots that you get every 3 months, hormonal IUD, and implant. These treatments reduce bleeding during your menstrual period. °· Medicines that thicken blood and slow bleeding. °· Medicines that reduce swelling, such as ibuprofen.  °· Medicines that contain a synthetic hormone called progestin.   °· Medicines that make the ovaries stop working for a short time.   °You may need surgical treatment for menorrhagia if the medicines are unsuccessful. Treatment options include: °· Dilation and curettage (D&C). In this procedure, your health care provider opens (dilates) your cervix and then scrapes or suctions tissue from the lining of your uterus to reduce menstrual bleeding. °· Operative hysteroscopy. This procedure uses a tiny tube with a light (hysteroscope) to view your uterine cavity and can help in the surgical removal of a polyp that may be causing heavy periods. °· Endometrial ablation.  Through various techniques, your health care provider permanently destroys the entire lining of your uterus (endometrium). After endometrial ablation, most women have little or no menstrual flow. Endometrial ablation reduces your ability to become pregnant. °· Endometrial resection. This surgical procedure uses an electrosurgical wire loop to remove the lining of the uterus. This procedure also reduces your ability to become pregnant. °· Hysterectomy. Surgical removal of the uterus and cervix is a permanent procedure that stops menstrual periods. Pregnancy is not possible after a hysterectomy. This procedure requires anesthesia and hospitalization. °HOME CARE INSTRUCTIONS  °· Only take over-the-counter or prescription medicines as directed by your health care provider. Take prescribed medicines exactly as directed. Do not change or switch medicines without consulting your health care provider. °· Take any prescribed iron pills exactly as directed by your health care provider. Long-term heavy bleeding may result in low iron levels. Iron pills help replace the iron your body lost from heavy bleeding. Iron may cause constipation. If this becomes a problem, increase the bran, fruits, and roughage in your diet. °· Do not take aspirin or medicines that contain aspirin 1 week before or during your menstrual period. Aspirin may make the bleeding worse. °· If you need to change your sanitary pad or tampon more   than once every 2 hours, stay in bed and rest as much as possible until the bleeding stops. °· Eat well-balanced meals. Eat foods high in iron. Examples are leafy green vegetables, meat, liver, eggs, and whole grain breads and cereals. Do not try to lose weight until the abnormal bleeding has stopped and your blood iron level is back to normal. °SEEK MEDICAL CARE IF:  °· You soak through a pad or tampon every 1 or 2 hours, and this happens every time you have a period. °· You need to use pads and tampons at the same  time because you are bleeding so much. °· You need to change your pad or tampon during the night. °· You have a period that lasts for more than 8 days. °· You pass clots bigger than 1 inch wide. °· You have irregular periods that happen more or less often than once a month. °· You feel dizzy or faint. °· You feel very weak or tired. °· You feel short of breath or feel your heart is beating too fast when you exercise. °· You have nausea and vomiting or diarrhea while you are taking your medicine. °· You have any problems that may be related to the medicine you are taking. °SEEK IMMEDIATE MEDICAL CARE IF:  °· You soak through 4 or more pads or tampons in 2 hours. °· You have any bleeding while you are pregnant. °MAKE SURE YOU:  °· Understand these instructions. °· Will watch your condition. °· Will get help right away if you are not doing well or get worse. °  °This information is not intended to replace advice given to you by your health care provider. Make sure you discuss any questions you have with your health care provider. °  °Document Released: 08/02/2005 Document Revised: 08/07/2013 Document Reviewed: 01/21/2013 °Elsevier Interactive Patient Education ©2016 Elsevier Inc. ° °

## 2015-11-18 LAB — IRON AND TIBC
Iron: 120 ug/dL (ref 28–170)
Saturation Ratios: 27 % (ref 10.4–31.8)
TIBC: 439 ug/dL (ref 250–450)
UIBC: 319 ug/dL

## 2015-11-18 LAB — FERRITIN: Ferritin: 26 ng/mL (ref 11–307)

## 2015-11-21 LAB — URINE CULTURE
Culture: 10000 — AB
Special Requests: NORMAL

## 2016-09-18 ENCOUNTER — Emergency Department: Payer: Managed Care, Other (non HMO)

## 2016-09-18 ENCOUNTER — Encounter: Payer: Self-pay | Admitting: Emergency Medicine

## 2016-09-18 ENCOUNTER — Emergency Department
Admission: EM | Admit: 2016-09-18 | Discharge: 2016-09-18 | Disposition: A | Discharge status: Home / Self Care | Payer: Managed Care, Other (non HMO) | Attending: Emergency Medicine | Admitting: Emergency Medicine

## 2016-09-18 DIAGNOSIS — N83202 Unspecified ovarian cyst, left side: Secondary | ICD-10-CM | POA: Diagnosis not present

## 2016-09-18 DIAGNOSIS — F17210 Nicotine dependence, cigarettes, uncomplicated: Secondary | ICD-10-CM | POA: Diagnosis not present

## 2016-09-18 DIAGNOSIS — R102 Pelvic and perineal pain: Secondary | ICD-10-CM | POA: Diagnosis present

## 2016-09-18 DIAGNOSIS — R52 Pain, unspecified: Secondary | ICD-10-CM

## 2016-09-18 LAB — URINALYSIS, COMPLETE (UACMP) WITH MICROSCOPIC
BILIRUBIN URINE: NEGATIVE
GLUCOSE, UA: NEGATIVE mg/dL
Hgb urine dipstick: NEGATIVE
KETONES UR: NEGATIVE mg/dL
Leukocytes, UA: NEGATIVE
NITRITE: NEGATIVE
PH: 7 (ref 5.0–8.0)
Protein, ur: NEGATIVE mg/dL
SPECIFIC GRAVITY, URINE: 1.019 (ref 1.005–1.030)

## 2016-09-18 LAB — WET PREP, GENITAL
CLUE CELLS WET PREP: NONE SEEN
SPERM: NONE SEEN
TRICH WET PREP: NONE SEEN
YEAST WET PREP: NONE SEEN

## 2016-09-18 LAB — CHLAMYDIA/NGC RT PCR (ARMC ONLY)
CHLAMYDIA TR: NOT DETECTED
N gonorrhoeae: NOT DETECTED

## 2016-09-18 LAB — POCT PREGNANCY, URINE: Preg Test, Ur: NEGATIVE

## 2016-09-18 MED ORDER — IBUPROFEN 600 MG PO TABS: 600.0000 mg | ORAL_TABLET | Freq: Three times a day (TID) | ORAL | 0 refills | Status: DC | PRN

## 2016-09-18 MED ORDER — HYDROCODONE-ACETAMINOPHEN 5-325 MG PO TABS
1.0000 | ORAL_TABLET | Freq: Once | ORAL | Status: AC
  Administered 2016-09-18: 1 via ORAL
  Filled 2016-09-18: qty 1

## 2016-09-18 MED ORDER — HYDROCODONE-ACETAMINOPHEN 5-325 MG PO TABS: 1.0000 | ORAL_TABLET | Freq: Four times a day (QID) | ORAL | 0 refills | Status: DC | PRN

## 2016-09-18 NOTE — ED Triage Notes (Signed)
Pt presents to ED with left lower abd "cramping and throbbing". Pt states she was having intercourse around 1am when her symptoms started suddenly. Pain is making her feel nauseated. Denies vaginal bleeding or discharge.

## 2016-09-18 NOTE — ED Provider Notes (Signed)
[facility] Emergency Department Provider Note   ____________________________________________   First MD Initiated Contact with Patient 09/18/16 [phone]     (approximate)  I have reviewed the triage vital signs and the nursing notes.   HISTORY  Chief Complaint Abdominal Pain and Pelvic Pain    HPI Charlotte SanesBianca R Gosney is a [age] y.o. female who presents to the ED from home with a chief complaint of left pelvic pain. Patient reports left pelvic pain after sexual intercourse approximately 1 AM. Denies unusual activity or toys. Denies associated nausea, vomiting, bleeding or dysuria. Denies recent fever, chills, chest pain, shortness of breath. Denies recent travel trauma. Nothing makes her symptoms better or worse.   Past Medical History:  Diagnosis Date  . Urinary tract infection     There are no active problems to display for this patient.   History reviewed. No pertinent surgical history.  Prior to Admission medications   Medication Sig Start Date End Date Taking? Authorizing Provider  acetaminophen (TYLENOL) 325 MG tablet Take 650 mg by mouth every 6 (six) hours as needed. For pain    Historical Provider, MD  HYDROcodone-acetaminophen (NORCO) 5-325 MG tablet Take 1 tablet by mouth every 6 (six) hours as needed for moderate pain. 09/18/16   Irean HongJade J Trenton Passow, MD  ibuprofen (ADVIL,MOTRIN) 600 MG tablet Take 1 tablet (600 mg total) by mouth every 8 (eight) hours as needed. 09/18/16   Irean HongJade J Abdulmalik Darco, MD    Allergies Patient has no known allergies.  Family History  Problem Relation Age of Onset  . Hypertension Mother     Social History Social History  Substance Use Topics  . Smoking status: Current Every Day Smoker    Types: Cigarettes  . Smokeless tobacco: Never Used  . Alcohol use Yes     Comment: weekends    Review of Systems  Constitutional: No fever/chills. Eyes: No visual changes. ENT: No sore throat. Cardiovascular: Denies chest pain. Respiratory:  Denies shortness of breath. Gastrointestinal: Positive for pelvic pain. No abdominal pain.  No nausea, no vomiting.  No diarrhea.  No constipation. Genitourinary: Negative for dysuria. Musculoskeletal: Negative for back pain. Skin: Negative for rash. Neurological: Negative for headaches, focal weakness or numbness.  10-point ROS otherwise negative.  ____________________________________________   PHYSICAL EXAM:  VITAL SIGNS: ED Triage Vitals  Enc Vitals Group     BP 09/18/16 0130 124/88     Pulse Rate 09/18/16 0130 85     Resp 09/18/16 0130 18     Temp 09/18/16 0130 98.2 F (36.8 C)     Temp Source 09/18/16 0130 Oral     SpO2 --      Weight 09/18/16 0130 110 lb (49.9 kg)     Height 09/18/16 0130 5\' 1"  (1.549 m)     Head Circumference --      Peak Flow --      Pain Score 09/18/16 0148 10     Pain Loc --      Pain Edu? --      Excl. in GC? --     Constitutional: Alert and oriented. Well appearing and in no acute distress. Eyes: Conjunctivae are normal. PERRL. EOMI. Head: Atraumatic. Nose: No congestion/rhinnorhea. Mouth/Throat: Mucous membranes are moist.  Oropharynx non-erythematous. Neck: No stridor.   Cardiovascular: Normal rate, regular rhythm. Grossly normal heart sounds.  Good peripheral circulation. Respiratory: Normal respiratory effort.  No retractions. Lungs CTAB. Gastrointestinal: Soft and nontender to light or deep palpation. No distention. No abdominal  bruits. No CVA tenderness. Musculoskeletal: No lower extremity tenderness nor edema.  No joint effusions. Neurologic:  Normal speech and language. No gross focal neurologic deficits are appreciated. No gait instability. Skin:  Skin is warm, dry and intact. No rash noted. Psychiatric: Mood and affect are normal. Speech and behavior are normal.  ____________________________________________   LABS (all labs ordered are listed, but only abnormal results are displayed)  Labs Reviewed  WET PREP, GENITAL -  Abnormal; Notable for the following:       Result Value   WBC, Wet Prep HPF POC FEW (*)    All other components within normal limits  URINALYSIS, COMPLETE (UACMP) WITH MICROSCOPIC - Abnormal; Notable for the following:    Color, Urine YELLOW (*)    APPearance HAZY (*)    Bacteria, UA RARE (*)    Squamous Epithelial / LPF 0-5 (*)    All other components within normal limits  CHLAMYDIA/NGC RT PCR ([facility] ONLY)  POC URINE PREG, ED  POCT PREGNANCY, URINE   ____________________________________________  EKG  None ____________________________________________  RADIOLOGY  Pelvic US interpreted per Dr. Karie Kirks: 3.8 cm LEFT complex adnexal cyst, likely hemorrhagic. Recommend  follow-up pelvic sonogram in 6-12 weeks to insure resolution.   ____________________________________________   PROCEDURES  Procedure(s) performed:   Pelvic exam: External exam WNL without rashes, lesions or vesicles. Speculum exam with scant white discharge. No bleeding. Cervical os closed. Bimanual exam mildly tender to left adnexa.  Procedures  Critical Care performed: No  ____________________________________________   INITIAL IMPRESSION / ASSESSMENT AND PLAN / ED COURSE  Pertinent labs & imaging results that were available during my care of the patient were reviewed by me and considered in my medical decision making (see chart for details).  26 year old female who presents with left adnexal discomfort after sexual intercourse. Urinalysis is unremarkable; patient is not pregnant. Patient appears comfortable; low suspicion for ovarian torsion. Will proceed with pelvic ultrasound to further evaluate etiology of patient's discomfort.  Clinical Course as of Sep 18 621  Sat Sep 18, 2016  1610 Updated patient of US imaging results. Strict return precautions given. Patient verbalizes understanding and agrees with plan of care.  [JS]    Clinical Course User Index [JS] Irean Hong, MD      ____________________________________________   FINAL CLINICAL IMPRESSION(S) / ED DIAGNOSES  Final diagnoses:  Pain  Pelvic pain in female  Cyst of left ovary      NEW MEDICATIONS STARTED DURING THIS VISIT:  New Prescriptions   HYDROCODONE-ACETAMINOPHEN (NORCO) 5-325 MG TABLET    Take 1 tablet by mouth every 6 (six) hours as needed for moderate pain.   IBUPROFEN (ADVIL,MOTRIN) 600 MG TABLET    Take 1 tablet (600 mg total) by mouth every 8 (eight) hours as needed.     Note:  This document was prepared using Dragon voice recognition software and may include unintentional dictation errors.    Irean Hong, MD 09/18/16 [phone]

## 2016-09-18 NOTE — Discharge Instructions (Signed)
1.  You may take pain medicines as needed (Motrin/Norco #15). °2.  Return to the ER for worsening symptoms, persistent vomiting, difficulty breathing or other concerns. °

## 2016-09-18 NOTE — ED Notes (Signed)
Pt. States sudden crapping that stated in pelvic area about two hours ago.  Pt. States the pain radiates up lt. Abdominal area.  Pt. States some difficulty urinating.  Pt. Denies vomiting.  Pt. States pain is similar to menstrual cycle

## 2016-10-20 ENCOUNTER — Telehealth: Payer: Self-pay

## 2016-10-20 NOTE — Telephone Encounter (Signed)
RN to get more info .Has been seen for BV recently.

## 2016-10-20 NOTE — Telephone Encounter (Signed)
Nothing we have treated her for deems being put out of work on [employer]. If she feels she needs this, then she needs to RTO for visit. RN to discuss with pt.

## 2016-10-20 NOTE — Telephone Encounter (Signed)
Pt states she has been out of work intermittently due to her problems period and an ovarian cyst and overall concerns. Pt is a [employer] employee and aware that she would need to come in and pay $25 for FMLA paperwork. Also encouraged appt to discuss these concerns to accurately determine if these issues would be appropriate need for being out of work.

## 2016-10-20 NOTE — Telephone Encounter (Signed)
Pt is requesting [employer] to fill out some forms for work for personal leave due to some complications she has been having but did not give additional information, only that it was related to recent visits. Please advise. Thank you.

## 2016-10-20 NOTE — Telephone Encounter (Signed)
Pt aware and will discuss at appt on 11/23/16

## 2016-11-04 ENCOUNTER — Encounter: Payer: Self-pay | Admitting: Obstetrics & Gynecology

## 2016-11-04 ENCOUNTER — Ambulatory Visit (INDEPENDENT_AMBULATORY_CARE_PROVIDER_SITE_OTHER): Payer: Managed Care, Other (non HMO) | Admitting: Obstetrics & Gynecology

## 2016-11-04 VITALS — BP 110/70 | Ht 61.0 in | Wt 116.0 lb

## 2016-11-04 DIAGNOSIS — R1032 Left lower quadrant pain: Secondary | ICD-10-CM | POA: Diagnosis not present

## 2016-11-04 DIAGNOSIS — B009 Herpesviral infection, unspecified: Secondary | ICD-10-CM | POA: Diagnosis not present

## 2016-11-04 DIAGNOSIS — Z8619 Personal history of other infectious and parasitic diseases: Secondary | ICD-10-CM | POA: Insufficient documentation

## 2016-11-04 LAB — NUSWAB VAGINITIS PLUS (VG+)
CHLAMYDIA, DNA PROBE: POSITIVE
TRICH VAG BY NAA: POSITIVE

## 2016-11-04 LAB — HPV, HIGH-RISK
Chlamydia by NAA: POSITIVE
HPV, high-risk: POSITIVE

## 2016-11-04 NOTE — Progress Notes (Signed)
Gynecology Pelvic Pain Evaluation   Chief Complaint:  Chief Complaint  Patient presents with  . Vaginitis    pt c/o finding a bump in vaginal area    History of Present Illness:   Patient is a 26 y.o. G3P1011 who LMP was Patient's last menstrual period was 10/31/2016., presents today for a problem visit.  She complains of blisters, bumps and pain. Blister type lesion a few days ago that was irritative but not that painful in labia (R), but resolved prior to today. 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  Her pain is localized to the LLQ and also can be all over lower pelvix (intermittant in its occurance) area, described as intermittent and aching, began several months ago and its severity is described as mild. The pain radiates to the  Non-radiating. She has these associated symptoms which include bloating/abdominal distension. Patient has these modifiers which include relaxation and pain medication that make it better and unable to associate with any factor that make it worse.  Context includes: spontaneous.  Previous studies include ultrasound.  Previous evaluation: emergency room visit on 2/18. Prior Diagnosis: LEFT HEMORRHAGIC OVARIAN CYST. Previous Treatment: NSAID.  PMHx: She  has a past medical history of ASCUS with positive high risk HPV cervical (10/05/2012); History of chlamydia (10/05/2012); and Urinary tract infection. Also,  has no past surgical history on file., family history includes Hypertension in her mother; Lung cancer in her mother.,  reports that she has been smoking Cigarettes.  She has never used smokeless tobacco. She reports that she drinks alcohol.  She has a current medication list which includes the following prescription(s): levonorgestrel-ethinyl estradiol, acetaminophen, hydrocodone-acetaminophen, and ibuprofen. Also, has No Known Allergies.  Review of Systems  Constitutional: Negative for chills, fever and malaise/fatigue.  HENT: Negative for  congestion, sinus pain and sore throat.   Eyes: Negative for blurred vision and pain.  Respiratory: Negative for cough and wheezing.   Cardiovascular: Negative for chest pain and leg swelling.  Gastrointestinal: Negative for abdominal pain, constipation, diarrhea, heartburn, nausea and vomiting.  Genitourinary: Negative for dysuria, frequency, hematuria and urgency.  Musculoskeletal: Negative for back pain, joint pain, myalgias and neck pain.  Skin: Negative for itching and rash.  Neurological: Negative for dizziness, tremors and weakness.  Endo/Heme/Allergies: Does not bruise/bleed easily.  Psychiatric/Behavioral: Negative for depression. The patient is not nervous/anxious and does not have insomnia.     Objective: BP 110/70   Ht 5\' 1"  (1.549 m)   Wt 116 lb (52.6 kg)   LMP 10/31/2016   BMI 21.92 kg/m  Physical Exam  Constitutional: She is oriented to person, place, and time. She appears well-developed and well-nourished. No distress.  Musculoskeletal: Normal range of motion.  Neurological: She is alert and oriented to person, place, and time.  Skin: Skin is warm and dry.  Psychiatric: She has a normal mood and affect.  Vitals reviewed. Resolved lesion so no exam. She did have pictures of lesion that was reviewed.  Assessment: 26 y.o. G3P1011 with h/o HSV and current sx's do not sound like HSV due to quick nature of resolution; also as she is no longer symptomatic then no tx necessary; contagious nature of HSV discussed.  LLQ pain also discussed an dmild today.  She has US appt in 2 weeks for f/u.Marland Kitchen.  Problem List Items Addressed This Visit      Other   History of PCR DNA positive for HSV2    Other Visit Diagnoses  Left lower quadrant pain    -  Primary   HSV (herpes simplex virus) infection        Will re-eval and cultrure lesion if recurs. Options for suppressive therapy discussed as well w Valtrex, declined.  Annamarie Major, MD, [facility], [facility] 11/04/2016  1:57 PM

## 2016-11-22 ENCOUNTER — Other Ambulatory Visit: Payer: Self-pay | Admitting: Obstetrics and Gynecology

## 2016-11-22 ENCOUNTER — Encounter: Payer: Self-pay | Admitting: Obstetrics and Gynecology

## 2016-11-22 DIAGNOSIS — N83202 Unspecified ovarian cyst, left side: Secondary | ICD-10-CM

## 2016-11-23 ENCOUNTER — Encounter: Payer: Self-pay | Admitting: Obstetrics and Gynecology

## 2016-11-23 ENCOUNTER — Ambulatory Visit (INDEPENDENT_AMBULATORY_CARE_PROVIDER_SITE_OTHER): Payer: Managed Care, Other (non HMO) | Admitting: Obstetrics and Gynecology

## 2016-11-23 ENCOUNTER — Ambulatory Visit (INDEPENDENT_AMBULATORY_CARE_PROVIDER_SITE_OTHER): Payer: Managed Care, Other (non HMO)

## 2016-11-23 VITALS — BP 126/74 | HR 80 | Ht 61.0 in | Wt 119.0 lb

## 2016-11-23 DIAGNOSIS — Z01419 Encounter for gynecological examination (general) (routine) without abnormal findings: Secondary | ICD-10-CM | POA: Diagnosis not present

## 2016-11-23 DIAGNOSIS — Z124 Encounter for screening for malignant neoplasm of cervix: Secondary | ICD-10-CM | POA: Diagnosis not present

## 2016-11-23 DIAGNOSIS — N83202 Unspecified ovarian cyst, left side: Secondary | ICD-10-CM

## 2016-11-23 DIAGNOSIS — Z803 Family history of malignant neoplasm of breast: Secondary | ICD-10-CM | POA: Insufficient documentation

## 2016-11-23 DIAGNOSIS — Z3041 Encounter for surveillance of contraceptive pills: Secondary | ICD-10-CM | POA: Diagnosis not present

## 2016-11-23 DIAGNOSIS — Z113 Encounter for screening for infections with a predominantly sexual mode of transmission: Secondary | ICD-10-CM | POA: Diagnosis not present

## 2016-11-23 DIAGNOSIS — F419 Anxiety disorder, unspecified: Secondary | ICD-10-CM

## 2016-11-23 DIAGNOSIS — F418 Other specified anxiety disorders: Secondary | ICD-10-CM | POA: Diagnosis not present

## 2016-11-23 DIAGNOSIS — F32A Depression, unspecified: Secondary | ICD-10-CM | POA: Insufficient documentation

## 2016-11-23 DIAGNOSIS — F329 Major depressive disorder, single episode, unspecified: Secondary | ICD-10-CM | POA: Insufficient documentation

## 2016-11-23 MED ORDER — LEVONORGESTREL-ETHINYL ESTRAD 0.1-20 MG-MCG PO TABS: 1.0000 | ORAL_TABLET | Freq: Every day | ORAL | 4 refills | Status: AC

## 2016-11-23 NOTE — Progress Notes (Signed)
Chief Complaint  Patient presents with  . Gynecologic Exam    ready to quit smoking  LTO cyst u/s f/u  HPI:      [name] is a 26 y.o. [id number] who LMP was Patient's last menstrual period was 10/27/2016 (exact date)., presents today for her annual examination.  Her menses are mostly regular every 28-30 days, lasting 6 days. Dysmenorrhea mild, occurring first 1-2 days of flow. She does have intermenstrual bleeding. She is on her 2nd pill pack of OCPs wihtout significant side effects. She has noticed a little increased breast tenderness.    Sex activity: single partner, contraception - OCP (estrogen/progesterone). No dyspareunia. Last Pap: October 09, 2012  Results were: ASCUS with POSITIVE high risk HPV  Hx of STDs: chlamydia   There is a FH of breast cancer in her PGM and mat aunt, genetic testing not done. There is no FH of ovarian cancer. The patient does do self-breast exams.  Tobacco use: current Alcohol use: social drinker Exercise:  not active  She does not get adequate calcium and Vitamin D in her diet.  She is here to f/u on LTO hemorrhagic cyst from 2/18. She was started on OCPs and LLQ pain sx have resolved. She sometimes has a little cramping.   Pt complains of anxiety/depression sx since having her daughter 3 yrs ago, but sx have increased over the past few months. 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 She doesn't have much support and no time for herself. She feels depressed with anhedonia, little energy and has had SI. She also has trouble with worrying, feeling nervous, trouble relaxing and irritability. There is a FH of anxiety in her mom and schizophrenia with distant relative. She has never done meds or seen a therapist. She would rather not be on medication.  Past Medical History:  Diagnosis Date  . 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 Bacterial vaginosis   . History of chlamydia 10/05/2012  . Ovarian cyst   .  Urinary tract infection     History reviewed. No pertinent surgical history.  Family History  Problem Relation Age of Onset  . Hypertension Mother   . Breast cancer Maternal Aunt 42  . Lung cancer Maternal Grandmother 50  . Breast cancer Paternal Grandmother 83   Social History   Social History  . Marital status: Single    Spouse name: N/A  . Number of children: N/A  . Years of education: N/A   Occupational History  . Not on file.   Social History Main Topics  . Smoking status: Current Every Day Smoker    Types: Cigarettes  . Smokeless tobacco: Never Used  . Alcohol use Yes     Comment: weekends  . Drug use: No  . Sexual activity: Yes    Birth control/ protection: Pill   Other Topics Concern  . Not on file   Social History Narrative  . No narrative on file    Current Outpatient Prescriptions:  .  levonorgestrel-ethinyl estradiol (AVIANE,ALESSE,LESSINA) 0.1-20 MG-MCG tablet, Take 1 tablet by mouth daily., Disp: 3 Package, Rfl: 4  ROS:  Review of Systems  Constitutional: Negative for fever, malaise/fatigue and weight loss.  HENT: Negative for congestion, ear pain and sinus pain.   Respiratory: Negative for cough, shortness of breath and wheezing.   Cardiovascular: Negative for chest pain, orthopnea and leg swelling.  Gastrointestinal: Negative for constipation, diarrhea, nausea and vomiting.  Genitourinary: Negative for  dysuria, frequency, hematuria and urgency.       Breast ROS: tenderness  Musculoskeletal: Negative for back pain, joint pain and myalgias.  Skin: Negative for itching and rash.  Neurological: Negative for dizziness, tingling, focal weakness and headaches.  Endo/Heme/Allergies: Negative for environmental allergies. Does not bruise/bleed easily.  Psychiatric/Behavioral: Positive for depression. Negative for suicidal ideas. The patient is nervous/anxious. The patient does not have insomnia.     Objective: BP 126/74 (BP Location: Left Arm,  Patient Position: Sitting, Cuff Size: Normal)   Pulse 80   Ht  (1.549 m)   Wt 119 lb (54 kg)   LMP 10/27/2016 (Exact Date)   Breastfeeding? No   BMI 22.48 kg/m    Physical Exam  Constitutional: She is oriented to person, place, and time. She appears well-developed and well-nourished.  Genitourinary: Vagina normal and uterus normal. No erythema or tenderness in the vagina. No vaginal discharge found. Right adnexum does not display mass and does not display tenderness. Left adnexum does not display mass and does not display tenderness. Cervix does not exhibit motion tenderness or polyp. Uterus is not enlarged or tender.  Neck: Normal range of motion. No thyromegaly present.  Cardiovascular: Normal rate, regular rhythm and normal heart sounds.   No murmur heard. Pulmonary/Chest: Effort normal and breath sounds normal. Right breast exhibits no mass, no nipple discharge, no skin change and no tenderness. Left breast exhibits no mass, no nipple discharge, no skin change and no tenderness.  Abdominal: Soft. There is no tenderness. There is no guarding.  Musculoskeletal: Normal range of motion.  Neurological: She is alert and oriented to person, place, and time. No cranial nerve deficit.  Psychiatric: She has a normal mood and affect. Her behavior is normal.  Vitals reviewed.   Results: GYN pelvic u/s--> EM=2.45 MM; 1.0 X 0.5 CM FF PCDS; BILAT OVAR WITH FOLLICLES; LTO CYST RESOLVED  GAD-7=19 PHQ-9=19  Assessment/Plan: Encounter for annual routine gynecological examination  Cervical cancer screening - Plan: IGP,CtNgTv,rfx Aptima HPV ASCU  Screening for STD (sexually transmitted disease) - Plan: IGP,CtNgTv,rfx Aptima HPV ASCU  Encounter for surveillance of contraceptive pills - Pt on 2nd pack of OCPs. Rx RF. F/u if still has BTB by 4th pill pack. Reassurance.  - Plan: levonorgestrel-ethinyl estradiol (AVIANE,ALESSE,LESSINA) 0.1-20 MG-MCG tablet  Cyst of left ovary - Resolved on u/s  today. On OCPs. F/u prn.  Anxiety and depression - Discussed seeing therapist, SSRIs. Pt doesn't want meds now. Will see therapist first and f/u if desires Rx. Increase exercise/time for herself. Go to ED if SI.  Family history of breast cancer - My Risk testing discussed and pt declines. F/u if desires.   15 MIN SPENT IN DIRECT CONTACT WITH PT RE: ANXIETY/DEPRESSION COUNSELING AND LTO CYST F/U            GYN counsel use and side effects of OCP's, adequate intake of calcium and vitamin D     F/U  Return in about 1 year (around 11/23/2017).  Nolin Grell B. Vincentina Sollers, PA-C 11/23/2016 11:21 AM

## 2016-11-25 LAB — IGP,CTNGTV,RFX APTIMA HPV ASCU
Chlamydia, Nuc. Acid Amp: NEGATIVE
Gonococcus, Nuc. Acid Amp: NEGATIVE
PAP Smear Comment: 0
Trich vag by NAA: NEGATIVE

## 2016-12-14 ENCOUNTER — Telehealth: Payer: Self-pay

## 2016-12-14 NOTE — Telephone Encounter (Signed)
Pt was on tri-sprintec in 2017. Is that what she wants to go back to? RN to discuss with pt. Thx.

## 2016-12-14 NOTE — Telephone Encounter (Signed)
Pt calling to see if possible to switch bcp back to prev bcp.  [phone].

## 2016-12-15 NOTE — Telephone Encounter (Signed)
Called pt who states she wants the pills she was on last month.  These have a diff package and taste diff.  Adv. Pharm probably changed them.  To d/w pharm.

## 2016-12-15 NOTE — Telephone Encounter (Signed)
Ok. Agree she needs to talk to pharm.

## 2016-12-30 NOTE — Telephone Encounter (Signed)
Pt to speak c pharm.

## 2017-08-01 ENCOUNTER — Encounter: Payer: Self-pay | Admitting: Emergency Medicine

## 2017-08-01 ENCOUNTER — Emergency Department
Admission: EM | Admit: 2017-08-01 | Discharge: 2017-08-01 | Disposition: A | Discharge status: Home / Self Care | Payer: Managed Care, Other (non HMO) | Attending: Emergency Medicine | Admitting: Emergency Medicine

## 2017-08-01 ENCOUNTER — Emergency Department: Payer: Managed Care, Other (non HMO)

## 2017-08-01 DIAGNOSIS — F17210 Nicotine dependence, cigarettes, uncomplicated: Secondary | ICD-10-CM | POA: Diagnosis not present

## 2017-08-01 DIAGNOSIS — M4124 Other idiopathic scoliosis, thoracic region: Secondary | ICD-10-CM | POA: Diagnosis not present

## 2017-08-01 DIAGNOSIS — Z79899 Other long term (current) drug therapy: Secondary | ICD-10-CM | POA: Diagnosis not present

## 2017-08-01 DIAGNOSIS — Z853 Personal history of malignant neoplasm of breast: Secondary | ICD-10-CM | POA: Insufficient documentation

## 2017-08-01 DIAGNOSIS — M545 Low back pain: Secondary | ICD-10-CM | POA: Diagnosis present

## 2017-08-01 LAB — POCT PREGNANCY, URINE: Preg Test, Ur: NEGATIVE

## 2017-08-01 NOTE — ED Triage Notes (Signed)
FIRST NURSE NOTE-back pain with right side pain. Ambulatory. No injury.

## 2017-08-01 NOTE — ED Triage Notes (Signed)
Pt presents with hip, back, leg pain x "a year or more" that she feels like is worse lately, and she felt like she could not get out of bed this weekend.. She also c/o right flank pain. Denies urinary symptoms, other than dark urine. NAD noted.

## 2017-08-01 NOTE — ED Provider Notes (Signed)
[facility] Emergency Department Provider Note   ____________________________________________   First MD Initiated Contact with Patient 08/01/17 1057     (approximate)  I have reviewed the triage vital signs and the nursing notes.   HISTORY  Chief Complaint Hip Pain and Flank Pain    HPI Samantha Duffy is a 26 y.o. female female complaining of low back pain and left hip pain intermittently for over a year. Patient state in the last 4 days pain has increased. Patient denies numbness/tingling of the left lower extremity. Patient denies bladder or bowel dysfunction. Patient rates pain as 7/10. Patient described a pain as a "tightness". No palliative measures for complaint.  Past Medical History:  Diagnosis Date  . 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 Bacterial vaginosis   . History of chlamydia 10/05/2012  . Ovarian cyst   . Urinary tract infection     Patient Active Problem List   Diagnosis Date Noted  . Family history of breast cancer 11/23/2016  . Anxiety and depression 11/23/2016  . History of PCR DNA positive for HSV2 11/04/2016    History reviewed. No pertinent surgical history.  Prior to Admission medications   Medication Sig Start Date End Date Taking? Authorizing Provider  levonorgestrel-ethinyl estradiol (AVIANE,ALESSE,LESSINA) 0.1-20 MG-MCG tablet Take 1 tablet by mouth daily. 11/23/16   Copland, Ilona SorrelAlicia B, PA-C    Allergies Patient has no known allergies.  Family History  Problem Relation Age of Onset  . Hypertension Mother   . Breast cancer Maternal Aunt 42  . Lung cancer Maternal Grandmother 50  . Breast cancer Paternal Grandmother 740    Social History Social History   Tobacco Use  . Smoking status: Current Every Day Smoker    Types: Cigarettes, Pipe  . Smokeless tobacco: Never Used  . Tobacco comment: black and milds 2 per day  Substance Use Topics  . Alcohol use: Yes    Comment: weekends  . Drug  use: No    Review of Systems Constitutional: No fever/chills Eyes: No visual changes. ENT: No sore throat. Cardiovascular: Denies chest pain. Respiratory: Denies shortness of breath. Gastrointestinal: No abdominal pain.  No nausea, no vomiting.  No diarrhea.  No constipation. Genitourinary: Negative for dysuria. Musculoskeletal: Positive for back and left hip pain Skin: Negative for rash. Neurological: Negative for headaches, focal weakness or numbness.   ____________________________________________   PHYSICAL EXAM:  VITAL SIGNS: ED Triage Vitals  Enc Vitals Group     BP 08/01/17 0958 128/90     Pulse Rate 08/01/17 0958 94     Resp 08/01/17 0958 18     Temp 08/01/17 0958 98.9 F (37.2 C)     Temp Source 08/01/17 0958 Oral     SpO2 08/01/17 0958 100 %     Weight 08/01/17 1003 115 lb (52.2 kg)     Height 08/01/17 1003 5\' 1"  (1.549 m)     Head Circumference --      Peak Flow --      Pain Score 08/01/17 1002 7     Pain Loc --      Pain Edu? --      Excl. in GC? --     Constitutional: Alert and oriented. Well appearing and in no acute distress. Cardiovascular: Normal rate, regular rhythm. Grossly normal heart sounds.  Good peripheral circulation. Respiratory: Normal respiratory effort.  No retractions. Lungs CTAB. Musculoskeletal: Mild right deviation of thoracic spine. Patient is guarding palpation L1 4  through S1. Patient decreased range of motion with flexion of the back complaining of pain. Lateral motions are full and equal. Examination of the hip shows no leg length discrepancy. Patient has increased guarding with abduction of the hip. Patient has negative straight leg test.  Neurologic:  Normal speech and language. No gross focal neurologic deficits are appreciated. No gait instability. Skin:  Skin is warm, dry and intact. No rash noted. Psychiatric: Mood and affect are normal. Speech and behavior are normal.  ____________________________________________    LABS (all labs ordered are listed, but only abnormal results are displayed)  Labs Reviewed  URINALYSIS, COMPLETE (UACMP) WITH MICROSCOPIC  POC URINE PREG, ED  POCT PREGNANCY, URINE   ____________________________________________  EKG   ____________________________________________  RADIOLOGY  Dg Lumbar Spine 2-3 Views  Result Date: 08/01/2017 CLINICAL DATA:  LEFT hip and lower back pain for 1 year. RIGHT flank pain. EXAM: LUMBAR SPINE - 2-3 VIEW COMPARISON:  None. FINDINGS: There is no evidence of lumbar spine fracture. Alignment is normal except for scoliosis convex RIGHT, upper lumbar region, greater than 10 degrees. This is incompletely evaluated in the absence of thoracic spine plain films. No renal calculi are visible. Intervertebral disc spaces are maintained. IMPRESSION: Mild scoliosis.  No intrinsic lumbar spine abnormality is evident. Electronically Signed   By: Elsie StainJohn T Curnes M.D.   On: 08/01/2017 11:33   Dg Hip Unilat W Or Wo Pelvis 2-3 Views Left  Result Date: 08/01/2017 CLINICAL DATA:  Left hip and low back pain for more than a year with increased severity recently. Unable to get out of bed this weekend. Also right flank pain. EXAM: DG HIP (WITH OR WITHOUT PELVIS) 2-3V LEFT COMPARISON:  None in PACs FINDINGS: The bony pelvis is subjectively adequately mineralized. No acute or old fracture is observed. There is some scleroses involving the lower iliac aspect of the right SI joint. AP and lateral views of the left hip reveal preservation of the joint space. The articular surfaces of the femoral head and acetabulum remains smoothly rounded. The femoral neck, intertrochanteric, and subtrochanteric regions are normal. IMPRESSION: There is no acute or significant chronic bony abnormality of the left hip. An SI joint series would be useful. Electronically Signed   By: David  Swazil[place] M.D.   On: 08/01/2017 11:33     ____________________________________________   PROCEDURES  Procedure(s) performed: None  Procedures  Critical Care performed: No  ____________________________________________   INITIAL IMPRESSION / ASSESSMENT AND PLAN / ED COURSE  As part of my medical decision making, I reviewed the following data within the electronic MEDICAL RECORD NUMBER    Patient presented with chronic low back and left hip pain. Discuss x-ray findings the patient is consistent with scoliosis. Advised patient to follow-up with orthopedics for definitive evaluation and treatment.      ____________________________________________   FINAL CLINICAL IMPRESSION(S) / ED DIAGNOSES  Final diagnoses:  Other idiopathic scoliosis, thoracic region     ED Discharge Orders    None       Note:  This document was prepared using Dragon voice recognition software and may include unintentional dictation errors.    Joni ReiningSmith, Ronald K, PA-C 08/01/17 1204    Darci CurrentBrown, [place] N, MD 08/01/17 [phone]

## 2018-08-24 ENCOUNTER — Emergency Department
Admission: EM | Admit: 2018-08-24 | Discharge: 2018-08-24 | Disposition: A | Discharge status: Home / Self Care | Payer: Self-pay | Attending: Emergency Medicine | Admitting: Emergency Medicine

## 2018-08-24 ENCOUNTER — Encounter: Payer: Self-pay | Admitting: *Deleted

## 2018-08-24 ENCOUNTER — Other Ambulatory Visit: Payer: Self-pay

## 2018-08-24 DIAGNOSIS — Z79899 Other long term (current) drug therapy: Secondary | ICD-10-CM | POA: Insufficient documentation

## 2018-08-24 DIAGNOSIS — F17210 Nicotine dependence, cigarettes, uncomplicated: Secondary | ICD-10-CM | POA: Insufficient documentation

## 2018-08-24 DIAGNOSIS — R110 Nausea: Secondary | ICD-10-CM

## 2018-08-24 DIAGNOSIS — N926 Irregular menstruation, unspecified: Secondary | ICD-10-CM

## 2018-08-24 LAB — URINALYSIS, COMPLETE (UACMP) WITH MICROSCOPIC
Bacteria, UA: NONE SEEN
Bilirubin Urine: NEGATIVE
GLUCOSE, UA: NEGATIVE mg/dL
Ketones, ur: NEGATIVE mg/dL
Leukocytes, UA: NEGATIVE
Nitrite: NEGATIVE
Protein, ur: NEGATIVE mg/dL
SPECIFIC GRAVITY, URINE: 1.009 (ref 1.005–1.030)
pH: 7 (ref 5.0–8.0)

## 2018-08-24 LAB — POCT PREGNANCY, URINE: Preg Test, Ur: NEGATIVE

## 2018-08-24 MED ORDER — METOCLOPRAMIDE HCL 10 MG PO TABS: 10.0000 mg | ORAL_TABLET | Freq: Four times a day (QID) | ORAL | 0 refills | Status: AC | PRN

## 2018-08-24 NOTE — ED Triage Notes (Signed)
Pt reports cold sx with sneezing, and nausea.  No abd pain. No fever.  Pt alert.

## 2018-08-24 NOTE — Discharge Instructions (Addendum)
Follow up with the gynecologist if your menstrual cycle does not return to normal.  Return to the ER for symptoms that change or worsen if unable to schedule an appointment.

## 2018-08-25 NOTE — ED Provider Notes (Signed)
[facility] Emergency Department Provider Note  ____________________________________________  Time seen: Approximately 12:00 AM  I have reviewed the triage vital signs and the nursing notes.   HISTORY  Chief Complaint Nasal Congestion   HPI Samantha Duffy is a 28 y.o. female who presents emergency department for treatment and evaluation of sneezing and nausea.  She also states that she has had some breast tenderness and noticed some weight gain.  She questions whether or not she is pregnant.  She states that she is currently on her menstrual cycle but states that it was lighter than usual and has been lighter.  She denies any cough, fever, chills, body aches, or abdominal pain/vaginal discharge.  Past Medical History:  Diagnosis Date  . 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 Bacterial vaginosis   . History of chlamydia 10/05/2012  . Ovarian cyst   . Urinary tract infection     Patient Active Problem List   Diagnosis Date Noted  . Family history of breast cancer 11/23/2016  . Anxiety and depression 11/23/2016  . History of PCR DNA positive for HSV2 11/04/2016    No past surgical history on file.  Prior to Admission medications   Medication Sig Start Date End Date Taking? Authorizing Provider  levonorgestrel-ethinyl estradiol (AVIANE,ALESSE,LESSINA) 0.1-20 MG-MCG tablet Take 1 tablet by mouth daily. 11/23/16   Copland, Ilona Sorrel, PA-C  metoCLOPramide (REGLAN) 10 MG tablet Take 1 tablet (10 mg total) by mouth every 6 (six) hours as needed for up to 7 days for nausea. 08/24/18 08/31/18  Chinita Pester, FNP    Allergies Patient has no known allergies.  Family History  Problem Relation Age of Onset  . Hypertension Mother   . Breast cancer Maternal Aunt 42  . Lung cancer Maternal Grandmother 50  . Breast cancer Paternal Grandmother 97    Social History Social History   Tobacco Use  . Smoking status: Current Every Day Smoker   Types: Cigarettes, Pipe  . Smokeless tobacco: Never Used  . Tobacco comment: black and milds 2 per day  Substance Use Topics  . Alcohol use: Yes    Comment: weekends  . Drug use: No    Review of Systems Constitutional: Negative for fever. ENT: Negative for sore throat. Respiratory: Negative for cough Gastrointestinal: No abdominal pain.  No nausea, no vomiting.  No diarrhea.  Musculoskeletal: Negative for generalized body aches. Skin: Negative for rash/lesion/wound. Neurological: Negative for headaches, focal weakness or numbness.  ____________________________________________   PHYSICAL EXAM:  VITAL SIGNS: ED Triage Vitals  Enc Vitals Group     BP --      Pulse Rate 08/24/18 1956 (!) 106     Resp 08/24/18 1956 18     Temp 08/24/18 1956 98.1 F (36.7 C)     Temp Source 08/24/18 1956 Oral     SpO2 08/24/18 1956 99 %     Weight 08/24/18 1958 114 lb (51.7 kg)     Height 08/24/18 1958 5\' 1"  (1.549 m)     Head Circumference --      Peak Flow --      Pain Score 08/24/18 1958 0     Pain Loc --      Pain Edu? --      Excl. in GC? --     Constitutional: Alert and oriented. Well appearing and in no acute distress. Eyes: Conjunctivae are normal. PERRL. EOMI. Head: Atraumatic. Nose: No congestion/rhinnorhea. Mouth/Throat: Mucous membranes are moist. Neck:  No stridor.  Cardiovascular: Normal rate, regular rhythm. Good peripheral circulation. Respiratory: Normal respiratory effort. Musculoskeletal: Full ROM throughout.  Neurologic:  Normal speech and language. No gross focal neurologic deficits are appreciated. Speech is normal. No gait instability. Skin:  Skin is warm, dry and intact. No rash noted. Psychiatric: Mood and affect are normal. Speech and behavior are normal.  ____________________________________________   LABS (all labs ordered are listed, but only abnormal results are displayed)  Labs Reviewed  URINALYSIS, COMPLETE (UACMP) WITH MICROSCOPIC - Abnormal;  Notable for the following components:      Result Value   Color, Urine YELLOW (*)    APPearance CLEAR (*)    Hgb urine dipstick SMALL (*)    All other components within normal limits  POC URINE PREG, ED  POCT PREGNANCY, URINE   ____________________________________________  EKG  Not indicated ____________________________________________  RADIOLOGY  Not indicated ____________________________________________   PROCEDURES  None ____________________________________________   INITIAL IMPRESSION / ASSESSMENT AND PLAN / ED COURSE     Pertinent labs & imaging results that were available during my care of the patient were reviewed by me and considered in my medical decision making (see chart for details).  Patient was advised to call and schedule a follow-up appointment with her gynecologist.  She was advised to repeat the test in few weeks and in the meantime to avoid alcohol and NSAIDs.  She was encouraged to maintain a healthy diet as well.  She is to return to the emergency department for symptoms of concern if unable to schedule an appointment. ____________________________________________   FINAL CLINICAL IMPRESSION(S) / ED DIAGNOSES  Final diagnoses:  Nausea in adult patient  Abnormal menses       Chinita Pester, FNP 08/25/18 0003    Minna Antis, MD 08/25/18 2305

## 2018-11-15 IMAGING — US US TRANSVAGINAL NON-OB
1 series · 13 of 25 positions shown · non-contrast
Comparison: None.

CLINICAL DATA: LEFT pelvic pain for 3 hours.

EXAM:
TRANSABDOMINAL AND TRANSVAGINAL ULTRASOUND OF PELVIS
DOPPLER ULTRASOUND OF OVARIES
TECHNIQUE: Both transabdominal and transvaginal ultrasound examinations of the
pelvis were performed. Transabdominal technique was performed for
global imaging of the pelvis including uterus, ovaries, adnexal
regions, and pelvic cul-de-sac.
It was necessary to proceed with endovaginal exam following the
transabdominal exam to visualize the adnexa. Color and duplex
Doppler ultrasound was utilized to evaluate blood flow to the
ovaries.

[Series 1: us transvaginal non-ob · 0.21mm/px · 130 acquisitions, 13 frames shown]
[im 1/130]
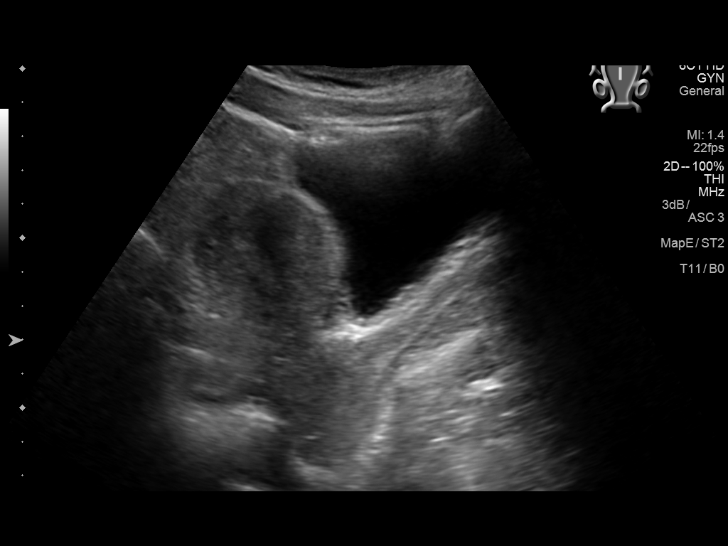
[im 11/130]
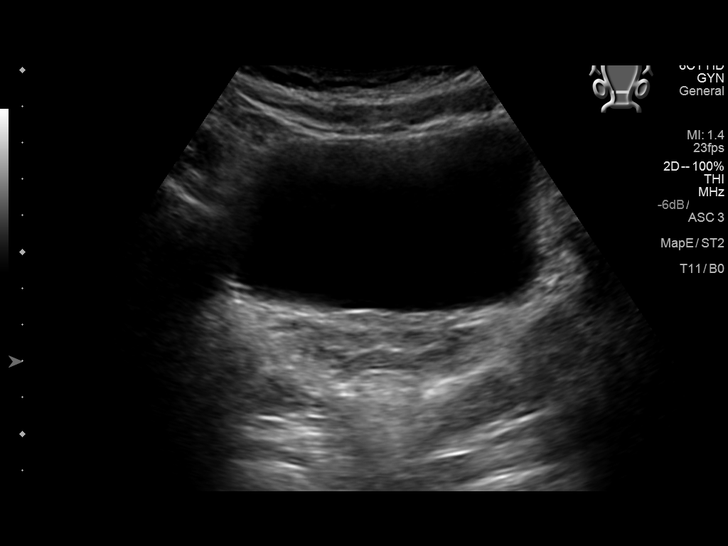
[im 22/130]
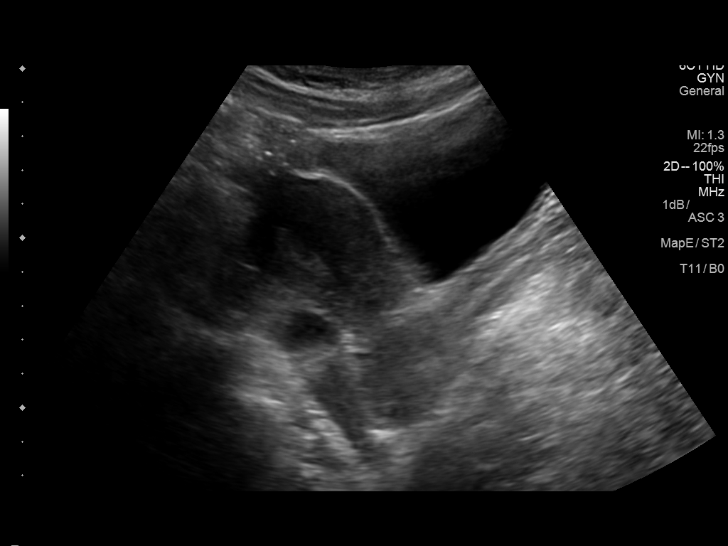
[im 33/130]
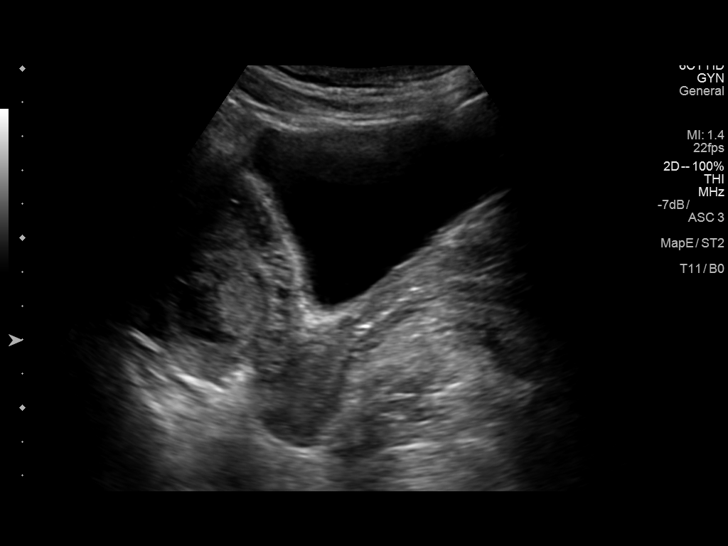
[im 44/130]
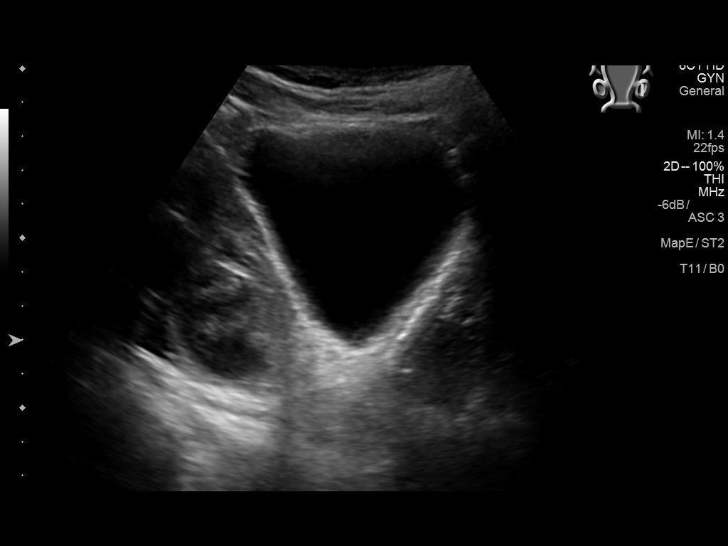
[im 54/130]
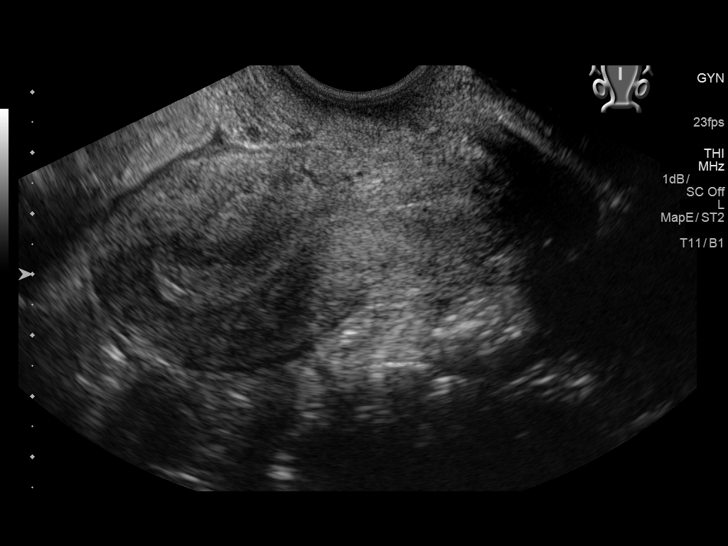
[im 65/130]
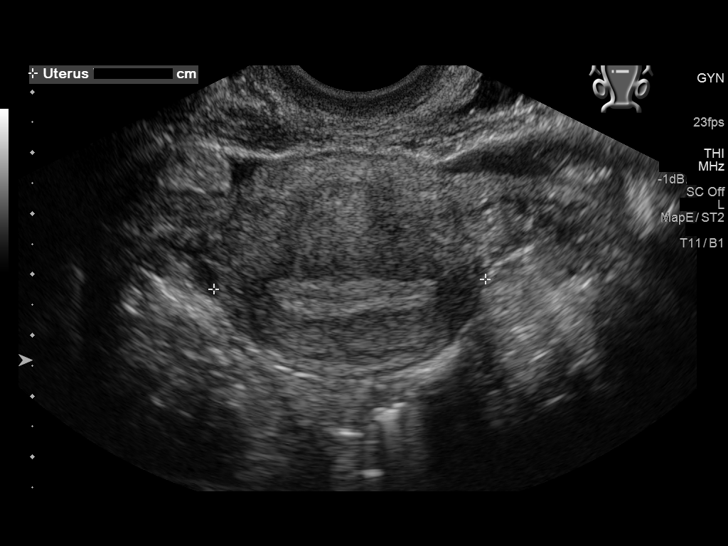
[im 76/130]
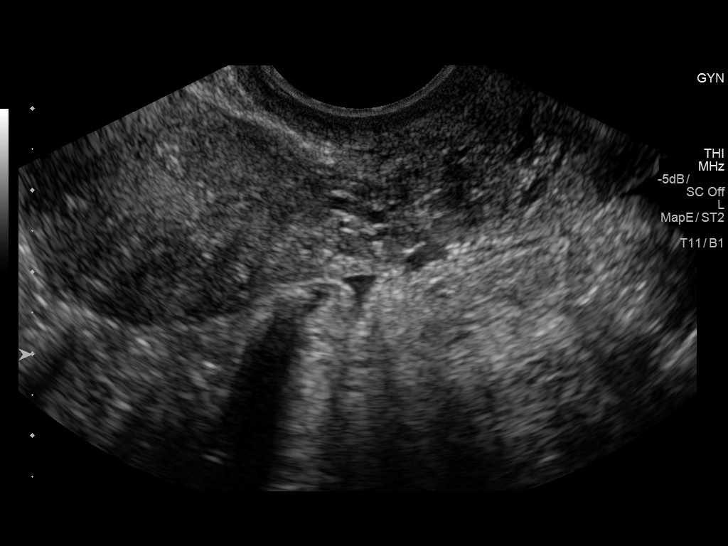
[im 87/130]
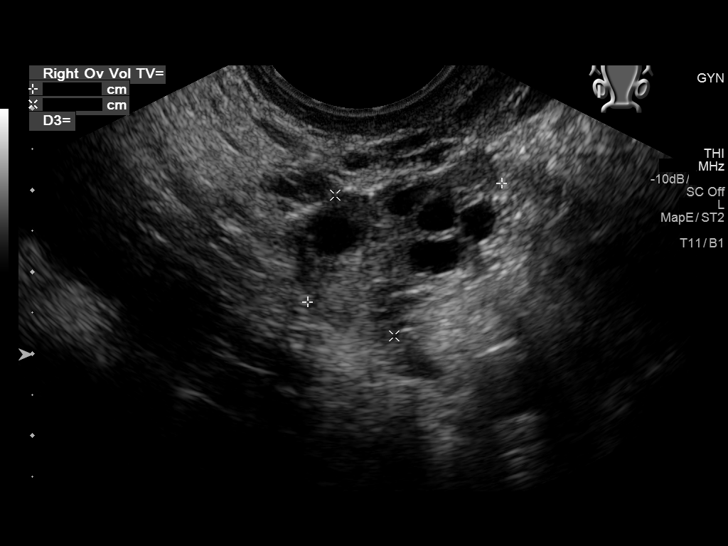
[im 97/130]
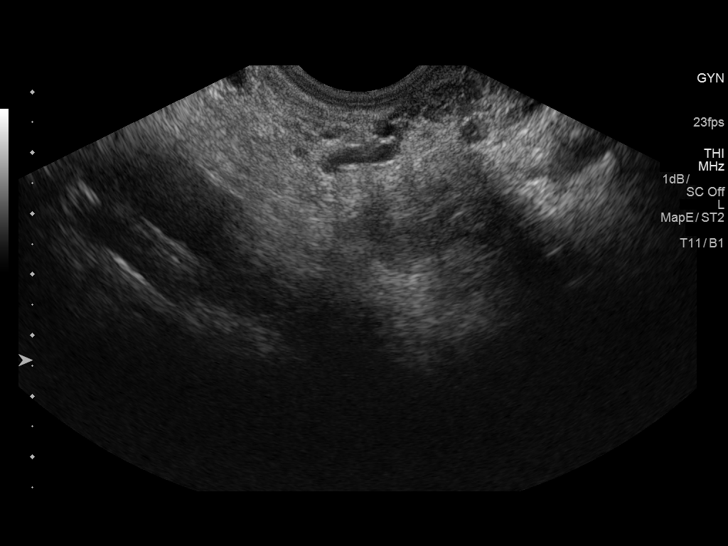
[im 108/130]
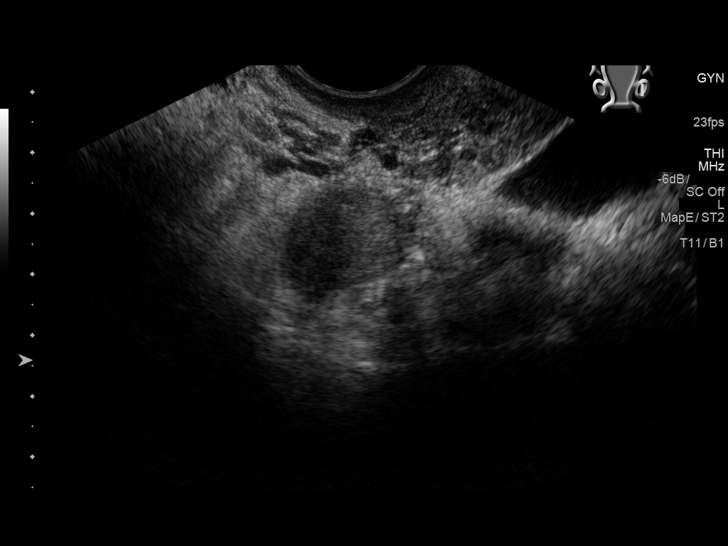
[im 119/130]
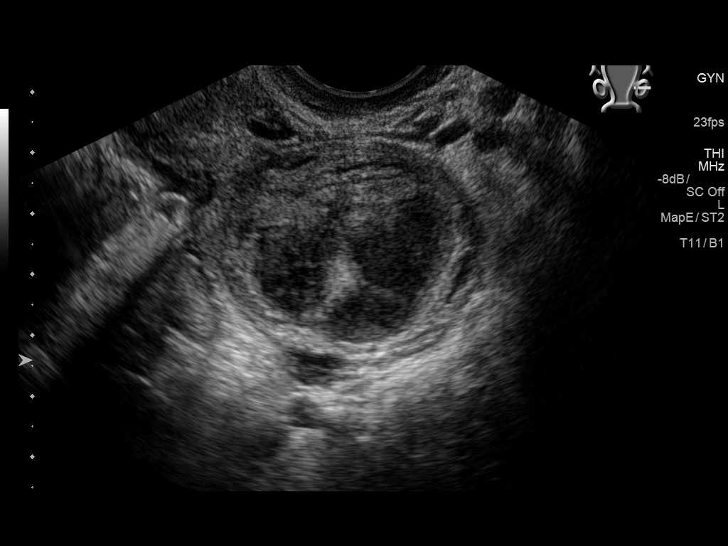
[im 130/130]
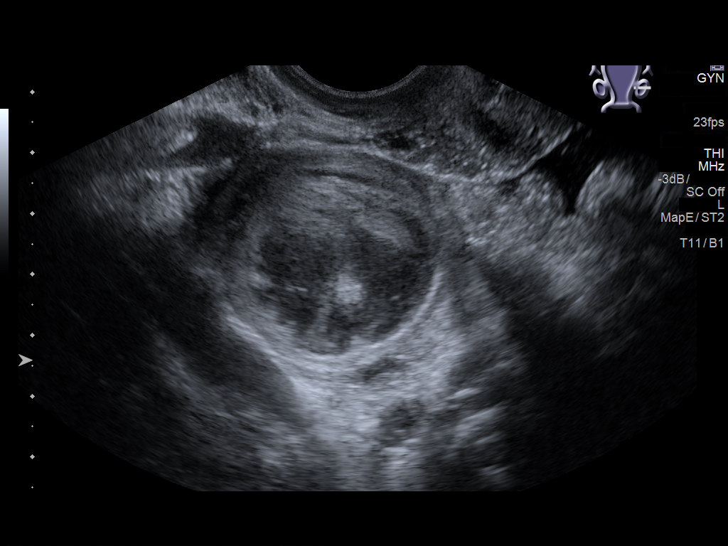

[13 of 25 positions shown; findings below may reference images not displayed]

FINDINGS: Uterus

Measurements: 7.4 x 3.6 x 4.5 cm. No fibroids or other mass
visualized.

Endometrium

Thickness: 8 mm.  No focal abnormality visualized.

Right ovary

Measurements: 2.8 x 1.9 x 3 cm. Normal appearance/no adnexal mass.

Left ovary

Measurements: 5.8 x 4.5 x 4.9 cm. 3.1 x 3.3 x 3.8 cm thick-walled,
heterogeneously centrally anechoic mass with acoustic enhancement.

Pulsed Doppler evaluation of both ovaries demonstrates normal
low-resistance arterial and venous waveforms.

Other findings

No abnormal free fluid.
IMPRESSION: 3.8 cm LEFT complex adnexal cyst, likely hemorrhagic. Recommend
follow-up pelvic sonogram in 6-12 weeks to insure resolution.

## 2019-02-04 IMAGING — CR DG HIP (WITH OR WITHOUT PELVIS) 2-3V*L*
1 series · 3 of 3 positions shown · non-contrast
Comparison: None in PACs

CLINICAL DATA: Left hip and low back pain for more than a year with
increased severity recently. Unable to get out of bed this weekend.
Also right flank pain.

EXAM:
DG HIP (WITH OR WITHOUT PELVIS) 2-3V LEFT

[Series 1: dg hip unilat w or w/o pelvis 2-3 views  · non-contrast · 0.14mm/px · 3 of 3 slices shown]
[im 1/3]
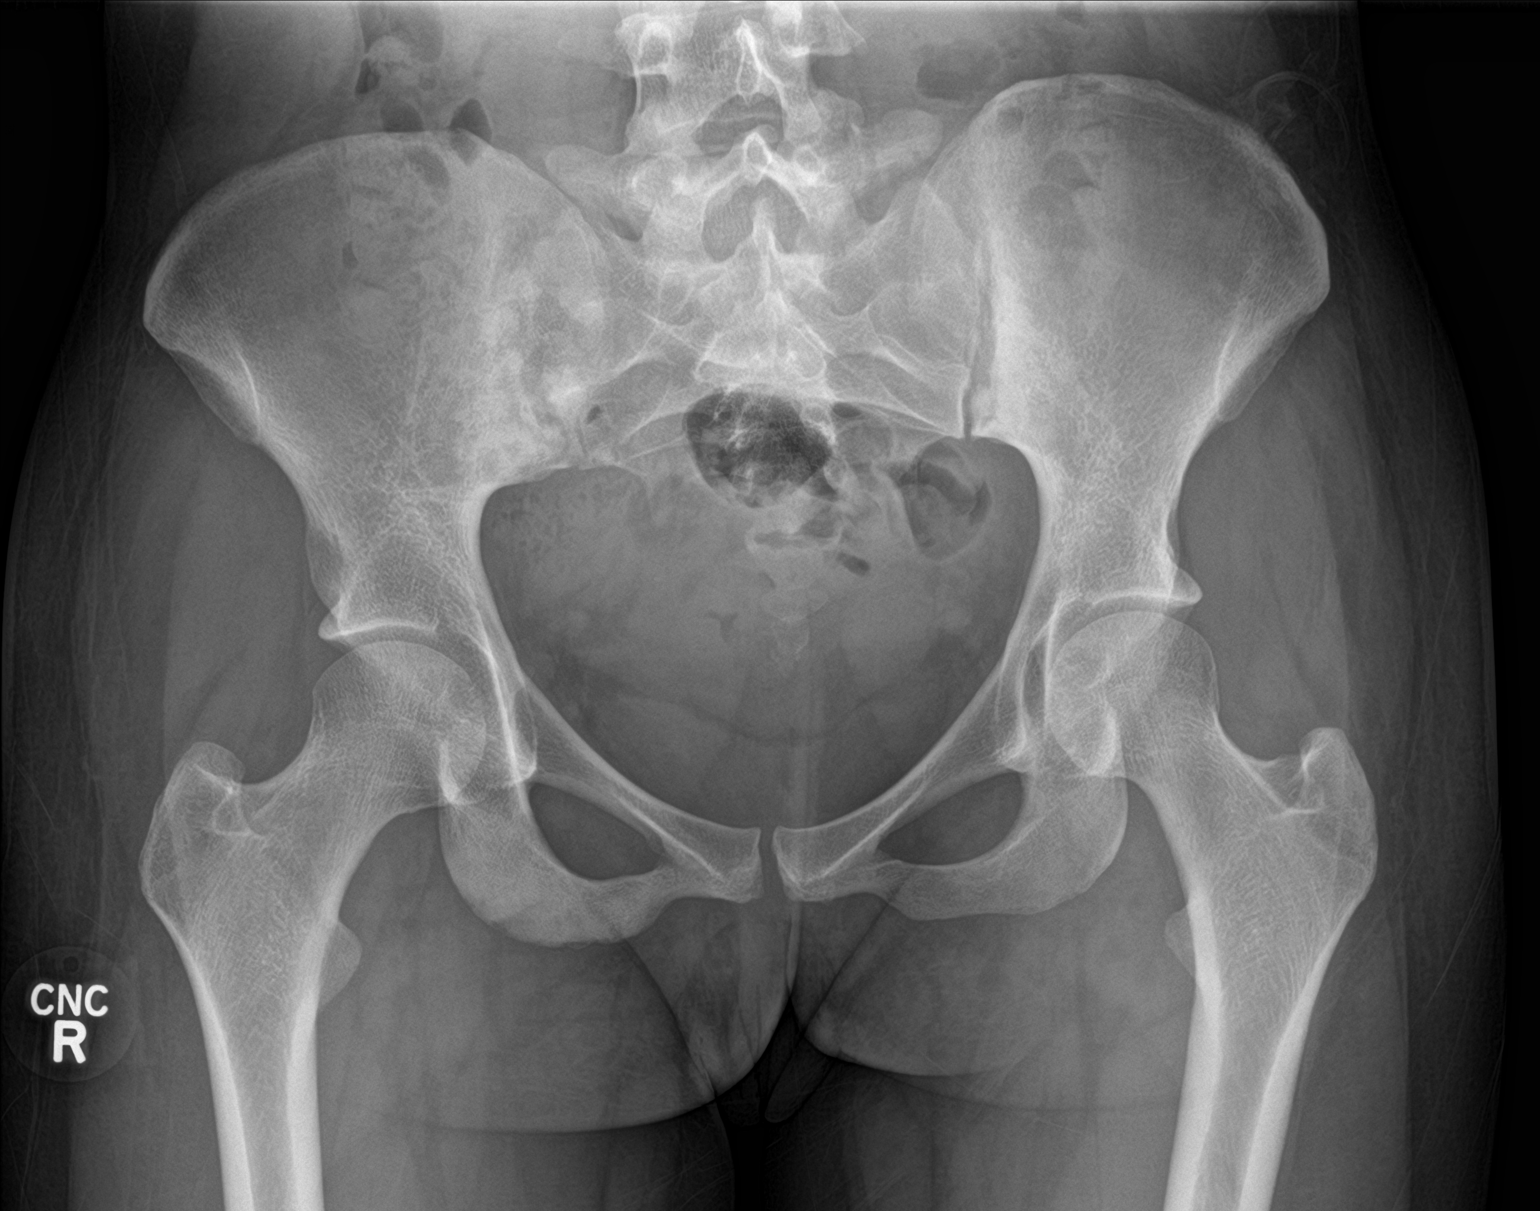
[im 2/3]
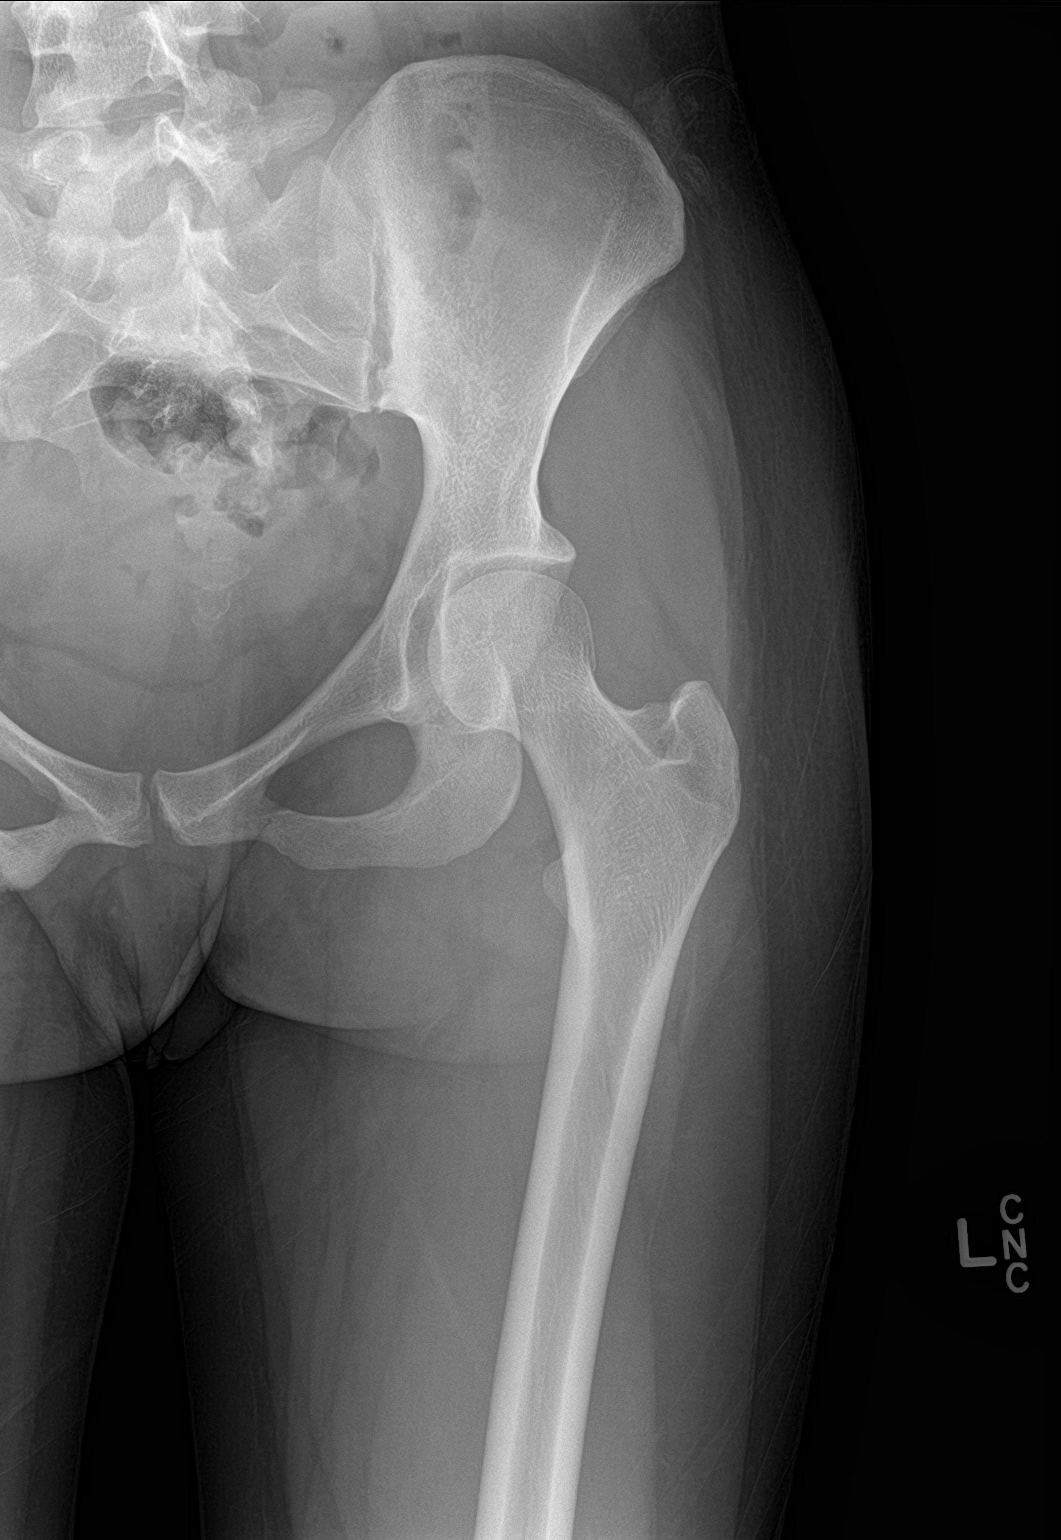
[im 3/3]
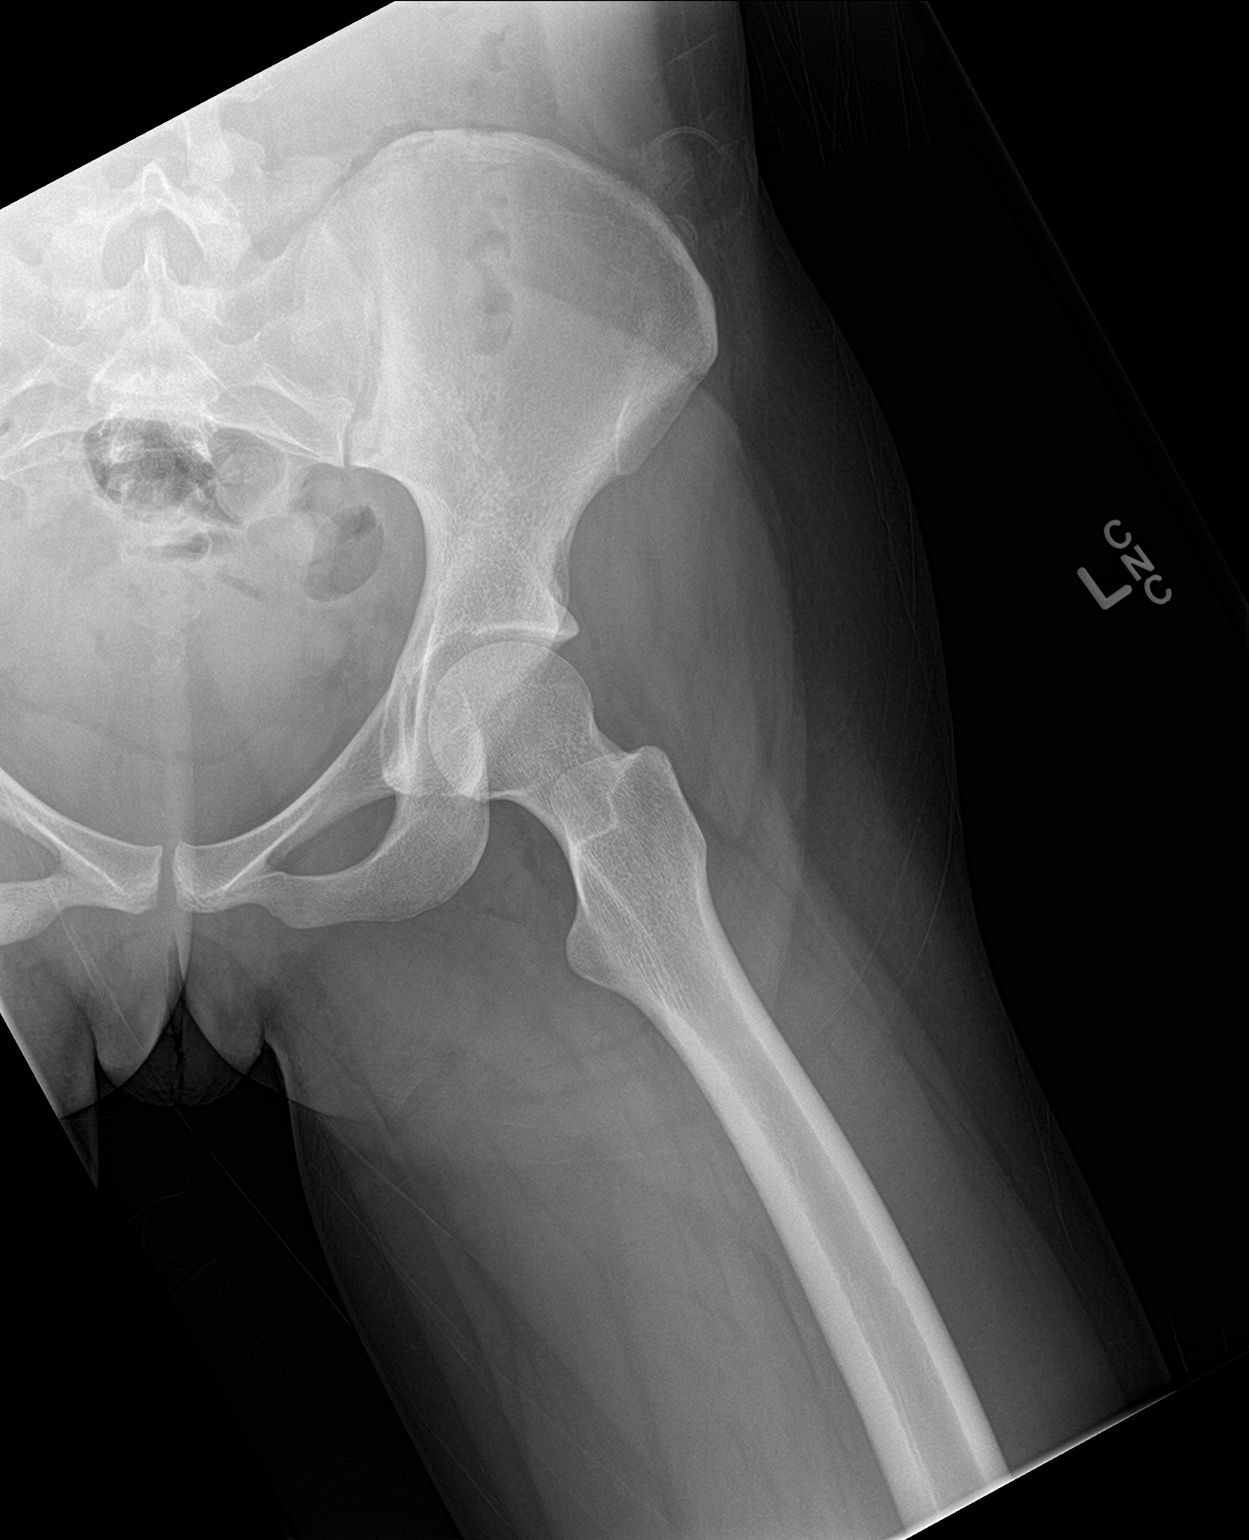

[3 of 3 positions shown; findings below may reference images not displayed]

FINDINGS: The bony pelvis is subjectively adequately mineralized. No acute or
old fracture is observed. There is some scleroses involving the
lower iliac aspect of the right SI joint. AP and lateral views of
the left hip reveal preservation of the joint space. The articular
surfaces of the femoral head and acetabulum remains smoothly
rounded. The femoral neck, intertrochanteric, and subtrochanteric
regions are normal.
IMPRESSION: There is no acute or significant chronic bony abnormality of the
left hip.

An SI joint series would be useful.

## 2019-06-06 ENCOUNTER — Other Ambulatory Visit: Payer: Self-pay | Admitting: *Deleted

## 2019-06-06 DIAGNOSIS — Z20822 Contact with and (suspected) exposure to covid-19: Secondary | ICD-10-CM

## 2019-06-08 LAB — NOVEL CORONAVIRUS, NAA: SARS-CoV-2, NAA: NOT DETECTED

## 2020-03-03 ENCOUNTER — Ambulatory Visit: Payer: Managed Care, Other (non HMO) | Admitting: Obstetrics and Gynecology

## 2020-03-27 ENCOUNTER — Ambulatory Visit (INDEPENDENT_AMBULATORY_CARE_PROVIDER_SITE_OTHER): Payer: Managed Care, Other (non HMO) | Admitting: Obstetrics and Gynecology

## 2020-03-27 ENCOUNTER — Other Ambulatory Visit: Payer: Self-pay

## 2020-03-27 ENCOUNTER — Encounter: Payer: Self-pay | Admitting: Obstetrics and Gynecology

## 2020-03-27 VITALS — BP 120/80 | Ht 61.0 in | Wt 122.0 lb

## 2020-03-27 DIAGNOSIS — N926 Irregular menstruation, unspecified: Secondary | ICD-10-CM

## 2020-03-27 DIAGNOSIS — Z113 Encounter for screening for infections with a predominantly sexual mode of transmission: Secondary | ICD-10-CM

## 2020-03-27 DIAGNOSIS — Z01419 Encounter for gynecological examination (general) (routine) without abnormal findings: Secondary | ICD-10-CM

## 2020-03-27 DIAGNOSIS — Z124 Encounter for screening for malignant neoplasm of cervix: Secondary | ICD-10-CM | POA: Diagnosis not present

## 2020-03-27 DIAGNOSIS — R8290 Unspecified abnormal findings in urine: Secondary | ICD-10-CM | POA: Diagnosis not present

## 2020-03-27 DIAGNOSIS — R102 Pelvic and perineal pain: Secondary | ICD-10-CM

## 2020-03-27 LAB — POCT URINALYSIS DIPSTICK
Bilirubin, UA: NEGATIVE
Blood, UA: NEGATIVE
Glucose, UA: NEGATIVE
Ketones, UA: NEGATIVE
Leukocytes, UA: NEGATIVE
Nitrite, UA: NEGATIVE
Protein, UA: NEGATIVE
Spec Grav, UA: 1.02 (ref 1.010–1.025)
pH, UA: 6 (ref 5.0–8.0)

## 2020-03-27 LAB — POCT URINE PREGNANCY: Preg Test, Ur: NEGATIVE

## 2020-03-27 NOTE — Patient Instructions (Signed)
I value your feedback and entrusting us with your care. If you get a [facility] patient survey, I would appreciate you taking the time to let us know about your experience today. Thank you!  As of July 26, 2019, your lab results will be released to your MyChart immediately, before I even have a chance to see them. Please give me time to review them and contact you if there are any abnormalities. Thank you for your patience.  

## 2020-03-27 NOTE — Progress Notes (Signed)
Chief Complaint  Patient presents with  . Gynecologic Exam    HPI:      Samantha Duffy is a 29 y.o. [id number] who LMP was Patient's last menstrual period was 03/10/2020., presents today for her NP> 3 yrs annual examination.  Her menses are regular every 28-30 days, lasting 6-7 days, mod to heavy flow. Dysmenorrhea mild, before bleeding. She does not usually have intermenstrual bleeding. Had BTB wk before period this cycle, and menses was only 4 days. Pt concerned. D/C'd OCPs a long time ago.  Has noticed some pelvic "heaviness" with occas sharp pains  X 2 wks. Sx increase with activity/movement. Also with darker urine and some LBP. No fevers, dysuria. Does have frequency with good flow and drinks Red Bull type drinks regularly. Hx of ovar cysts. Has also had some loose stool the past wk.  Sex activity: single partner, contraception - condoms sometimes. Last Pap: 11/23/16  Results were: no abnormalities; 2014 pap with ASCUS with POSITIVE high risk HPV . Repeat pap today Hx of STDs: chlamydia  There is a FH of breast cancer in her mat aunt and mat grt aunt, genetic testing not indicated. There is no FH of ovarian cancer. The patient does do self-breast exams.  Tobacco use: current--2 black and milds daily Alcohol use: social drinker  No drug use Exercise:  not active  She does not get adequate calcium and Vitamin D in her diet.   Past Medical History:  Diagnosis Date  . 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 Bacterial vaginosis   . History of chlamydia 10/05/2012  . Ovarian cyst   . Urinary tract infection     History reviewed. No pertinent surgical history.  Family History  Problem Relation Age of Onset  . Hypertension Mother   . Breast cancer Maternal Aunt 52  . Lung cancer Maternal Grandmother 50  . Lung cancer Paternal Grandmother 17   Social History   Socioeconomic History  . Marital status: Single    Spouse name: Not on file  . Number of  children: Not on file  . Years of education: Not on file  . Highest education level: Not on file  Occupational History  . Not on file  Tobacco Use  . Smoking status: Current Every Day Smoker    Types: Cigarettes, Pipe  . Smokeless tobacco: Never Used  . Tobacco comment: black and milds 2 per day  Vaping Use  . Vaping Use: Never used  Substance and Sexual Activity  . Alcohol use: Yes    Comment: weekends  . Drug use: No  . Sexual activity: Yes    Birth control/protection: Pill  Other Topics Concern  . Not on file  Social History Narrative  . Not on file   Social Determinants of Health   Financial Resource Strain:   . Difficulty of Paying Living Expenses:   Food Insecurity:   . Worried About Programme researcher, broadcasting/film/video in the Last Year:   . Barista in the Last Year:   Transportation Needs:   . Freight forwarder (Medical):   Marland Kitchen Lack of Transportation (Non-Medical):   Physical Activity:   . Days of Exercise per Week:   . Minutes of Exercise per Session:   Stress:   . Feeling of Stress :   Social Connections:   . Frequency of Communication with Friends and Family:   . Frequency of Social Gatherings with Friends and Family:   .  Attends Religious Services:   . Active Member of Clubs or Organizations:   . Attends Banker Meetings:   Marland Kitchen Marital Status:   Intimate Partner Violence:   . Fear of Current or Ex-Partner:   . Emotionally Abused:   Marland Kitchen Physically Abused:   . Sexually Abused:     Current Outpatient Medications:  .  levonorgestrel-ethinyl estradiol (AVIANE,ALESSE,LESSINA) 0.1-20 MG-MCG tablet, Take 1 tablet by mouth daily. (Patient not taking: Reported on 03/27/2020), Disp: 3 Package, Rfl: 4 .  metoCLOPramide (REGLAN) 10 MG tablet, Take 1 tablet (10 mg total) by mouth every 6 (six) hours as needed for up to 7 days for nausea., Disp: 28 tablet, Rfl: 0  ROS:  Review of Systems  Constitutional: Negative for fever, malaise/fatigue and weight loss.    HENT: Negative for congestion, ear pain and sinus pain.   Respiratory: Positive for wheezing. Negative for cough and shortness of breath.   Cardiovascular: Negative for chest pain, orthopnea and leg swelling.  Gastrointestinal: Negative for constipation, diarrhea, nausea and vomiting.  Genitourinary: Negative for dysuria, frequency, hematuria and urgency.       Breast ROS: tenderness  Musculoskeletal: Negative for back pain, joint pain and myalgias.  Skin: Negative for itching and rash.  Neurological: Negative for dizziness, tingling, focal weakness and headaches.  Endo/Heme/Allergies: Negative for environmental allergies. Does not bruise/bleed easily.  Psychiatric/Behavioral: Positive for depression. Negative for suicidal ideas. The patient is nervous/anxious. The patient does not have insomnia.     Objective: BP 120/80   Ht 5\' 1"  (1.549 m)   Wt 122 lb (55.3 kg)   LMP 03/10/2020   BMI 23.05 kg/m    Physical Exam Constitutional:      Appearance: She is well-developed.  Genitourinary:     Vulva, vagina, cervix, uterus, right adnexa and left adnexa normal.     No vulval lesion or tenderness noted.     No vaginal discharge, erythema or tenderness.     No cervical polyp.     Uterus is not enlarged or tender.     No right or left adnexal mass present.     Right adnexa not tender.     Left adnexa not tender.  Neck:     Thyroid: No thyromegaly.  Cardiovascular:     Rate and Rhythm: Normal rate and regular rhythm.     Heart sounds: Normal heart sounds. No murmur heard.   Pulmonary:     Effort: Pulmonary effort is normal.     Breath sounds: Normal breath sounds.  Chest:     Breasts:        Right: No mass, nipple discharge, skin change or tenderness.        Left: No mass, nipple discharge, skin change or tenderness.  Abdominal:     Palpations: Abdomen is soft.     Tenderness: There is abdominal tenderness in the suprapubic area. There is no guarding or rebound.   Musculoskeletal:        General: Normal range of motion.     Cervical back: Normal range of motion.  Neurological:     General: No focal deficit present.     Mental Status: She is alert and oriented to person, place, and time.     Cranial Nerves: No cranial nerve deficit.  Skin:    General: Skin is warm and dry.  Psychiatric:        Mood and Affect: Mood normal.        Behavior: Behavior normal.  Thought Content: Thought content normal.        Judgment: Judgment normal.  Vitals reviewed.     Results: Results for orders placed or performed in visit on 03/27/20 (from the past 24 hour(s))  POCT urine pregnancy     Status: Normal   Collection Time: 03/27/20  3:51 PM  Result Value Ref Range   Preg Test, Ur Negative Negative  POCT Urinalysis Dipstick     Status: Normal   Collection Time: 03/27/20  3:51 PM  Result Value Ref Range   Color, UA yellow    Clarity, UA clear    Glucose, UA Negative Negative   Bilirubin, UA neg    Ketones, UA neg    Spec Grav, UA 1.020 1.010 - 1.025   Blood, UA neg    pH, UA 6.0 5.0 - 8.0   Protein, UA Negative Negative   Urobilinogen, UA     Nitrite, UA neg    Leukocytes, UA Negative Negative   Appearance     Odor      Assessment/Plan: Encounter for annual routine gynecological examination  Cervical cancer screening - Plan: IGP,CtNgTv,rfx Aptima HPV ASCU,   Screening for STD (sexually transmitted disease) - Plan: IGP,CtNgTv,rfx Aptima HPV ASCU  Irregular menses - Plan: POCT urine pregnancy, Neg UPT, rule out STD. Reassurance. Cont to follow cycles.   Abnormal urine odor - Plan: POCT Urinalysis Dipstick, Urine Culture; neg UA. Check C&S.  Pelvic pain - Plan: Urine Culture; minimally tender on exam. Rule out UTI. If C&S neg, question GI given loose stools. If sx persist, can check GYN u/s (hx of ovar cysts)            GYN counsel adequate intake of calcium and vitamin D     F/U  Return in about 1 year (around 03/27/2021).  Arline Ketter  B. Jari Dipasquale, PA-C 03/27/2020 3:54 PM

## 2020-03-30 LAB — IGP,CTNGTV,RFX APTIMA HPV ASCU
Chlamydia, Nuc. Acid Amp: NEGATIVE
Gonococcus, Nuc. Acid Amp: NEGATIVE
Trich vag by NAA: NEGATIVE

## 2020-03-30 LAB — URINE CULTURE

## 2020-07-16 ENCOUNTER — Encounter: Payer: Self-pay | Admitting: Obstetrics and Gynecology

## 2023-03-24 ENCOUNTER — Ambulatory Visit (LOCAL_COMMUNITY_HEALTH_CENTER): Payer: Medicaid Other

## 2023-03-24 DIAGNOSIS — Z111 Encounter for screening for respiratory tuberculosis: Secondary | ICD-10-CM

## 2023-03-24 NOTE — Progress Notes (Signed)
In nurse clinic for PPDR.  PPD placed at [facility] on 03/21/2023.  Patient has copy of PPD placement info.   PPDR 5 mm /Negative. PPDR documentation given to patient. PPD with PPDR Copy sent for scanning. Jerel Shepherd, RN
